# Patient Record
Sex: Male | Born: 1965 | Race: Black or African American | Hispanic: No | Marital: Single | Smoking: Never smoker
Health system: Southern US, Community
[De-identification: ages and names within clinical notes are randomized; demographics above are authoritative.]

## PROBLEM LIST (undated history)

## (undated) DIAGNOSIS — I10 Essential (primary) hypertension: Secondary | ICD-10-CM

## (undated) DIAGNOSIS — R6250 Unspecified lack of expected normal physiological development in childhood: Secondary | ICD-10-CM

## (undated) DIAGNOSIS — C189 Malignant neoplasm of colon, unspecified: Secondary | ICD-10-CM

## (undated) DIAGNOSIS — L309 Dermatitis, unspecified: Secondary | ICD-10-CM

## (undated) HISTORY — DX: Essential (primary) hypertension: I10

## (undated) HISTORY — DX: Dermatitis, unspecified: L30.9

## (undated) HISTORY — DX: Unspecified lack of expected normal physiological development in childhood: R62.50

## (undated) HISTORY — DX: Malignant neoplasm of colon, unspecified: C18.9

## (undated) HISTORY — PX: COLON SURGERY: SHX602

---

## 2017-08-24 ENCOUNTER — Encounter: Payer: Self-pay | Admitting: Family Medicine

## 2017-08-24 ENCOUNTER — Ambulatory Visit (INDEPENDENT_AMBULATORY_CARE_PROVIDER_SITE_OTHER): Payer: Medicare Other | Admitting: Family Medicine

## 2017-08-24 ENCOUNTER — Other Ambulatory Visit: Payer: Self-pay

## 2017-08-24 VITALS — BP 138/80 | HR 87 | Temp 98.4°F | Ht 64.9 in | Wt 150.0 lb

## 2017-08-24 DIAGNOSIS — Z0000 Encounter for general adult medical examination without abnormal findings: Secondary | ICD-10-CM | POA: Insufficient documentation

## 2017-08-24 DIAGNOSIS — Z23 Encounter for immunization: Secondary | ICD-10-CM | POA: Diagnosis not present

## 2017-08-24 DIAGNOSIS — Z114 Encounter for screening for human immunodeficiency virus [HIV]: Secondary | ICD-10-CM

## 2017-08-24 DIAGNOSIS — L209 Atopic dermatitis, unspecified: Secondary | ICD-10-CM | POA: Insufficient documentation

## 2017-08-24 DIAGNOSIS — Z7689 Persons encountering health services in other specified circumstances: Secondary | ICD-10-CM

## 2017-08-24 DIAGNOSIS — R6250 Unspecified lack of expected normal physiological development in childhood: Secondary | ICD-10-CM | POA: Diagnosis not present

## 2017-08-24 NOTE — Progress Notes (Signed)
Subjective:  Bryan Ortiz is a 52 y.o. male who presents to the [facility] today to establish care with new provider.   HPI:  Patient moved back to [place] 2 years ago.  He is here to establish with new PCP.  He is intellectually disabled permanent.  He is taken care of by his brother Raeford Razor who was present at the visit and provides history.  Patient is minimally verbal.  He has seen psychiatry in the past, however he is not connected with 1 in the area.  ROS: As per HPI, otherwise all systems reviewed and are negative  PMH:  The following were reviewed and entered/updated in epic: Past Medical History:  Diagnosis Date  . Developmental delay    On disability   Patient Active Problem List   Diagnosis Date Noted  . Developmental delay 08/24/2017  . Atopic dermatitis 08/24/2017  . Healthcare maintenance 08/24/2017   Past Surgical History:  Procedure Laterality Date  . COLON SURGERY      Family History  Problem Relation Age of Onset  . Stroke Father   . Hypertension Father   . Kidney disease Sister   . Stroke Brother     Medications- reviewed and updated Current Outpatient Medications  Medication Sig Dispense Refill  . triamcinolone cream (KENALOG) 0.1 % Apply 1 application topically 2 (two) times daily.     No current facility-administered medications for this visit.     Allergies-reviewed and updated No Known Allergies  Social History   Socioeconomic History  . Marital status: Single    Spouse name: Not on file  . Number of children: Not on file  . Years of education: Not on file  . Highest education level: Not on file  Occupational History  . Occupation: Disabled  Social Needs  . Financial resource strain: Not hard at all  . Food insecurity:    Worry: Never true    Inability: Never true  . Transportation needs:    Medical: No    Non-medical: No  Tobacco Use  . Smoking status: Never Smoker  . Smokeless tobacco: Never Used  Substance and Sexual  Activity  . Alcohol use: Never    Frequency: Never  . Drug use: Never  . Sexual activity: Never    Birth control/protection: None  Lifestyle  . Physical activity:    Days per week: 0 days    Minutes per session: 0 min  . Stress: Not on file  Relationships  . Social connections:    Talks on phone: Not on file    Gets together: Not on file    Attends religious service: Not on file    Active member of club or organization: Not on file    Attends meetings of clubs or organizations: Not on file    Relationship status: Not on file  Other Topics Concern  . Not on file  Social History Narrative   Lives with Brother, Camille Thau, primary care giver.     Objective:  Physical Exam: BP 138/80   Pulse 87   Temp 98.4 F (36.9 C) (Oral)   Ht 5' 4.9" (1.648 m)   Wt 150 lb (68 kg)   SpO2 97%   BMI 25.04 kg/m   Gen: NAD, resting comfortably CV: RRR with no murmurs appreciated Pulm: NWOB, CTAB with no crackles, wheezes, or rhonchi GI: Normal bowel sounds present. Soft, Nontender, Nondistended. MSK: no edema, cyanosis, or clubbing noted Skin: Scaly dry lesion right foot/ankle juncture on ventral surface  Neuro: grossly normal, moves all extremities Psych: Nonverbal, pleasant affect  No results found for this or any previous visit (from the past 72 hour(s)).   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  Patient is well connected with social services and has good support from his brother,His primary caregiver.  Will refer to psychiatry to establish care in the area.    Atopic dermatitis Well controlled with Kenalog cream.    Healthcare maintenance Will order routine labs to get baseline for patient.    Lab Orders     Basic Metabolic Panel     HIV antibody (with reflex)     CBC with Differential  No orders of the defined types were placed in this encounter.   Marny Lowenstein, MD, MS FAMILY MEDICINE RESIDENT - PGY1 08/24/2017  4:45 PM

## 2017-08-24 NOTE — Assessment & Plan Note (Signed)
Will order routine labs to get baseline for patient.

## 2017-08-24 NOTE — Assessment & Plan Note (Signed)
Severe developmental delay from birth, nonverbal.  Patient is well connected with social services and has good support from his brother,His primary caregiver.  Will refer to psychiatry to establish care in the area.

## 2017-08-24 NOTE — Assessment & Plan Note (Signed)
Well controlled with Kenalog cream.

## 2017-08-24 NOTE — Patient Instructions (Signed)

## 2017-08-25 LAB — BASIC METABOLIC PANEL
BUN / CREAT RATIO: 14 (ref 9–20)
BUN: 14 mg/dL (ref 6–24)
CALCIUM: 10.1 mg/dL (ref 8.7–10.2)
CHLORIDE: 104 mmol/L (ref 96–106)
CO2: 24 mmol/L (ref 20–29)
CREATININE: 1 mg/dL (ref 0.76–1.27)
GFR calc non Af Amer: 88 mL/min/{1.73_m2} (ref >59)
GFR, EST AFRICAN AMERICAN: 102 mL/min/{1.73_m2} (ref >59)
Glucose: 125 mg/dL — ABNORMAL HIGH (ref 65–99)
Potassium: 4.1 mmol/L (ref 3.5–5.2)
Sodium: 140 mmol/L (ref 134–144)

## 2017-08-25 LAB — HIV ANTIBODY (ROUTINE TESTING W REFLEX): HIV Screen 4th Generation wRfx: NONREACTIVE

## 2017-08-25 LAB — CBC WITH DIFFERENTIAL/PLATELET
BASOS ABS: 0 10*3/uL (ref 0.0–0.2)
BASOS: 0 %
EOS (ABSOLUTE): 0.2 10*3/uL (ref 0.0–0.4)
Eos: 2 %
HEMOGLOBIN: 15.8 g/dL (ref 13.0–17.7)
Hematocrit: 48.7 % (ref 37.5–51.0)
IMMATURE GRANULOCYTES: 0 %
Immature Grans (Abs): 0 10*3/uL (ref 0.0–0.1)
Lymphocytes Absolute: 0.7 10*3/uL (ref 0.7–3.1)
Lymphs: 10 %
MCH: 29.8 pg (ref 26.6–33.0)
MCHC: 32.4 g/dL (ref 31.5–35.7)
MCV: 92 fL (ref 79–97)
MONOCYTES: 8 %
Monocytes Absolute: 0.6 10*3/uL (ref 0.1–0.9)
NEUTROS ABS: 5.6 10*3/uL (ref 1.4–7.0)
NEUTROS PCT: 80 %
PLATELETS: 187 10*3/uL (ref 150–450)
RBC: 5.31 x10E6/uL (ref 4.14–5.80)
RDW: 12.4 % (ref 12.3–15.4)
WBC: 7.1 10*3/uL (ref 3.4–10.8)

## 2017-08-27 ENCOUNTER — Encounter: Payer: Self-pay | Admitting: Family Medicine

## 2018-10-23 ENCOUNTER — Ambulatory Visit (INDEPENDENT_AMBULATORY_CARE_PROVIDER_SITE_OTHER): Payer: Medicare Other | Admitting: Family Medicine

## 2018-10-23 ENCOUNTER — Encounter: Payer: Self-pay | Admitting: Family Medicine

## 2018-10-23 ENCOUNTER — Other Ambulatory Visit: Payer: Self-pay

## 2018-10-23 VITALS — BP 124/70 | HR 93 | Ht 65.0 in | Wt 157.0 lb

## 2018-10-23 DIAGNOSIS — Z23 Encounter for immunization: Secondary | ICD-10-CM | POA: Diagnosis not present

## 2018-10-23 DIAGNOSIS — Z0000 Encounter for general adult medical examination without abnormal findings: Secondary | ICD-10-CM

## 2018-10-23 DIAGNOSIS — Z1211 Encounter for screening for malignant neoplasm of colon: Secondary | ICD-10-CM | POA: Diagnosis not present

## 2018-10-23 DIAGNOSIS — E162 Hypoglycemia, unspecified: Secondary | ICD-10-CM | POA: Diagnosis not present

## 2018-10-23 DIAGNOSIS — Z13220 Encounter for screening for lipoid disorders: Secondary | ICD-10-CM

## 2018-10-23 DIAGNOSIS — E663 Overweight: Secondary | ICD-10-CM

## 2018-10-23 DIAGNOSIS — Z9189 Other specified personal risk factors, not elsewhere classified: Secondary | ICD-10-CM | POA: Diagnosis not present

## 2018-10-23 DIAGNOSIS — L209 Atopic dermatitis, unspecified: Secondary | ICD-10-CM

## 2018-10-23 DIAGNOSIS — F79 Unspecified intellectual disabilities: Secondary | ICD-10-CM | POA: Diagnosis not present

## 2018-10-23 DIAGNOSIS — Z131 Encounter for screening for diabetes mellitus: Secondary | ICD-10-CM

## 2018-10-23 LAB — POCT GLYCOSYLATED HEMOGLOBIN (HGB A1C): Hemoglobin A1C: 5.6 % (ref 4.0–5.6)

## 2018-10-23 MED ORDER — CERAVE EX LOTN: 1.0000 "application " | TOPICAL_LOTION | Freq: Two times a day (BID) | CUTANEOUS | 0 refills | Status: DC

## 2018-10-23 NOTE — Progress Notes (Signed)
SUBJECTIVE:  Bryan Ortiz is a 53 y.o. male presenting for his annual checkup.  Patient with intellectual disability and minimally verbal.  Patient with eczema on fingers, backs of knees.  Mostly controlled with triamcinolone.  History provided by patient's primary caregiver, his brother.  Current Outpatient Medications  Medication Sig Dispense Refill  . triamcinolone cream (KENALOG) 0.1 % Apply 1 application topically 2 (two) times daily.    . Emollient (CERAVE) LOTN Apply 1 application topically 2 (two) times daily. 355 mL 0   No current facility-administered medications for this visit.     Personal history: History of colon cancer, colon surgery approximately 8 years ago.  History provided by brother, incomplete story.  Family history: In addition to what was already in the chart, also has family history of diabetes in sister.  Allergies: Patient has no known allergies.   ROS:  Feeling well. No dyspnea or chest pain on exertion. No abdominal pain, change in bowel habits, black or bloody stools. No urinary tract or prostatic symptoms. No neurological complaints.   OBJECTIVE:  The patient appears well, alert, oriented x 3, in no distress.  BP 124/70   Pulse 93   Ht 5\' 5"  (1.651 m)   Wt 157 lb (71.2 kg)   SpO2 98%   BMI 26.13 kg/m  ENT normal.  Neck supple. No adenopathy or thyromegaly. PERLA. Lungs are clear, good air entry, no wheezes, rhonchi or rales. S1 and S2 normal, no murmurs, regular rate and rhythm. Abdomen is soft without tenderness, guarding, mass or organomegaly. Extremities show no edema, normal peripheral pulses. Neurological is normal without focal findings.  Skin scaling skin on ventral aspect of fingers bilaterally consistent with eczema.  ASSESSMENT:  healthy adult male with severe intellectual disability and some baseline eczema  PLAN:  1.  Eczema: Continue triamcinolone cream add emollient 2.  History of colon cancer, of age needs to be having colon  screening, asymptomatic will refer to GI for further evaluation 3.  Health maintenance, lipid panel, hemoglobin A1c, 4.  Intellect disability: Establish with psychiatry, refer to social work for [employer]

## 2018-10-23 NOTE — Patient Instructions (Addendum)
We are referring you to gastroenterology for colonoscopy  We are referring you to psychiatry for intellectual disability  We are collecting routine labs to check for kidney function, cholesterol, diabetes   Preventive Care 59-51 Years Old, Male Preventive care refers to lifestyle choices and visits with your health care provider that can promote health and wellness. This includes:  A yearly physical exam. This is also called an annual well check.  Regular dental and eye exams.  Immunizations.  Screening for certain conditions.  Healthy lifestyle choices, such as eating a healthy diet, getting regular exercise, not using drugs or products that contain nicotine and tobacco, and limiting alcohol use. What can I expect for my preventive care visit? Physical exam Your health care provider will check:  Height and weight. These may be used to calculate body mass index (BMI), which is a measurement that tells if you are at a healthy weight.  Heart rate and blood pressure.  Your skin for abnormal spots. Counseling Your health care provider may ask you questions about:  Alcohol, tobacco, and drug use.  Emotional well-being.  Home and relationship well-being.  Sexual activity.  Eating habits.  Work and work Statistician. What immunizations do I need?  Influenza (flu) vaccine  This is recommended every year. Tetanus, diphtheria, and pertussis (Tdap) vaccine  You may need a Td booster every 10 years. Varicella (chickenpox) vaccine  You may need this vaccine if you have not already been vaccinated. Zoster (shingles) vaccine  You may need this after age 53. Measles, mumps, and rubella (MMR) vaccine  You may need at least one dose of MMR if you were born in 1957 or later. You may also need a second dose. Pneumococcal conjugate (PCV13) vaccine  You may need this if you have certain conditions and were not previously vaccinated. Pneumococcal polysaccharide (PPSV23)  vaccine  You may need one or two doses if you smoke cigarettes or if you have certain conditions. Meningococcal conjugate (MenACWY) vaccine  You may need this if you have certain conditions. Hepatitis A vaccine  You may need this if you have certain conditions or if you travel or work in places where you may be exposed to hepatitis A. Hepatitis B vaccine  You may need this if you have certain conditions or if you travel or work in places where you may be exposed to hepatitis B. Haemophilus influenzae type b (Hib) vaccine  You may need this if you have certain risk factors. Human papillomavirus (HPV) vaccine  If recommended by your health care provider, you may need three doses over 6 months. You may receive vaccines as individual doses or as more than one vaccine together in one shot (combination vaccines). Talk with your health care provider about the risks and benefits of combination vaccines. What tests do I need? Blood tests  Lipid and cholesterol levels. These may be checked every 5 years, or more frequently if you are over 88 years old.  Hepatitis C test.  Hepatitis B test. Screening  Lung cancer screening. You may have this screening every year starting at age 52 if you have a 30-pack-year history of smoking and currently smoke or have quit within the past 15 years.  Prostate cancer screening. Recommendations will vary depending on your family history and other risks.  Colorectal cancer screening. All adults should have this screening starting at age 58 and continuing until age 33. Your health care provider may recommend screening at age 61 if you are at increased risk.  You will have tests every 1-10 years, depending on your results and the type of screening test.  Diabetes screening. This is done by checking your blood sugar (glucose) after you have not eaten for a while (fasting). You may have this done every 1-3 years.  Sexually transmitted disease (STD) testing.  Follow these instructions at home: Eating and drinking  Eat a diet that includes fresh fruits and vegetables, whole grains, lean protein, and low-fat dairy products.  Take vitamin and mineral supplements as recommended by your health care provider.  Do not drink alcohol if your health care provider tells you not to drink.  If you drink alcohol: ? Limit how much you have to 0-2 drinks a day. ? Be aware of how much alcohol is in your drink. In the [place] one drink equals one 12 oz bottle of beer (355 mL), one 5 oz glass of wine (148 mL), or one 1 oz glass of hard liquor (44 mL). Lifestyle  Take daily care of your teeth and gums.  Stay active. Exercise for at least 30 minutes on 5 or more days each week.  Do not use any products that contain nicotine or tobacco, such as cigarettes, e-cigarettes, and chewing tobacco. If you need help quitting, ask your health care provider.  If you are sexually active, practice safe sex. Use a condom or other form of protection to prevent STIs (sexually transmitted infections).  Talk with your health care provider about taking a low-dose aspirin every day starting at age 67. What's next?  Go to your health care provider once a year for a well check visit.  Ask your health care provider how often you should have your eyes and teeth checked.  Stay up to date on all vaccines. This information is not intended to replace advice given to you by your health care provider. Make sure you discuss any questions you have with your health care provider. Document Released: 03/05/2015 Document Revised: 01/31/2018 Document Reviewed: 01/31/2018 Elsevier Patient Education  2020 [employer].

## 2018-10-24 ENCOUNTER — Encounter: Payer: Self-pay | Admitting: Family Medicine

## 2018-10-24 LAB — LIPID PANEL
Chol/HDL Ratio: 4.9 ratio (ref 0.0–5.0)
Cholesterol, Total: 182 mg/dL (ref 100–199)
HDL: 37 mg/dL — ABNORMAL LOW (ref >39)
LDL Chol Calc (NIH): 114 mg/dL — ABNORMAL HIGH (ref 0–99)
Triglycerides: 173 mg/dL — ABNORMAL HIGH (ref 0–149)
VLDL Cholesterol Cal: 31 mg/dL (ref 5–40)

## 2018-10-24 NOTE — Progress Notes (Signed)
The 10-year ASCVD risk score Mikey Bussing DC Brooke Bonito., et al., 2013) is: 5.6%   Values used to calculate the score:     Age: 53 years     Sex: Male     Is Non-Hispanic African American: Yes     Diabetic: No     Tobacco smoker: No     Systolic Blood Pressure: [id number] mmHg     Is BP treated: No     HDL Cholesterol: 37 mg/dL     Total Cholesterol: 182 mg/dL

## 2018-10-31 ENCOUNTER — Telehealth: Payer: Self-pay | Admitting: Licensed Clinical Social Worker

## 2018-10-31 NOTE — Telephone Encounter (Signed)
Care Coordination Phone outreach Note Social Work   10/31/2018 Name: Bryan Ortiz MRN: [id number] DOB: August 22, 1965  Bryan Ortiz is a 53 y.o. year old male who sees Bonnita Hollow, MD for primary care. LCSW was consulted for assistance with community resources for indiviudals with intellectual disability. Called patient;s brother [facility] to assess needs and barriers. Assessment : Patient lives with his brother.  Use to attend day workshops when he lived in [place] 5 years ago but has not participated in any programs since moving to [place].  Patient spends all day watching TV. Recommendation: Patient may benefit from and brother is in agreement to explore community day programs for patient. Brother is not interested in patient participating until after COVID-19.  Interventions: Provided patient's brother information on 3 local training and day programs Immunologist, [employer] and [employer] and support program.     Plan:  1. Patient's brother will call agencies provided to determine which program is best for patient. He will complete paper work and get on wait list if they have one. 2. No further LCSW services needed at this time. Patient's brother will call if he has questions  Dr. Grandville Silos has been informed of this outreach and plan.  Casimer Lanius, LCSW Clinical Social Worker [place] [facility] [place]   [phone] 10:22 AM

## 2018-12-07 ENCOUNTER — Other Ambulatory Visit: Payer: Self-pay | Admitting: Family Medicine

## 2018-12-07 DIAGNOSIS — L209 Atopic dermatitis, unspecified: Secondary | ICD-10-CM

## 2019-01-07 ENCOUNTER — Other Ambulatory Visit: Payer: Self-pay

## 2019-01-07 ENCOUNTER — Ambulatory Visit (INDEPENDENT_AMBULATORY_CARE_PROVIDER_SITE_OTHER): Payer: Medicare Other | Admitting: Family Medicine

## 2019-01-07 VITALS — BP 120/85 | HR 89 | Wt 150.4 lb

## 2019-01-07 DIAGNOSIS — L209 Atopic dermatitis, unspecified: Secondary | ICD-10-CM

## 2019-01-07 MED ORDER — BETAMETHASONE VALERATE 0.1 % EX OINT: 1.0000 "application " | TOPICAL_OINTMENT | Freq: Two times a day (BID) | CUTANEOUS | 0 refills | Status: DC

## 2019-01-07 NOTE — Assessment & Plan Note (Signed)
Severe eczema flare resistant to moderate steroid dose - Stop triamcinolone ointment - Start Betamethasone Valerate ointment BID for two weeks - F/u in two weeks to ensure good response; if not I would consider adding oral medications to control symptoms and referral to derm

## 2019-01-07 NOTE — Patient Instructions (Signed)
It was great to meet you today! Thank you for letting me participate in your care!  Today, we discussed Bryan Ortiz's recent eczema flare up. I have stopped the triamcinalone ointment and want him to use a strong steroid ointment that I have sent to the pharmacy. Please you use it as directed.   Continue using moisturizing lotions and creams 4 times per day to help the skin repair itself.   Please follow up in two weeks to see if he has had any improvement. If he has not gotten any better we will discuss adding further medications versus a referral to dermatology.2  Be well, Bryan Rutherford, DO PGY-3, [facility]

## 2019-01-07 NOTE — Progress Notes (Signed)
     Subjective: Chief Complaint  Patient presents with  . Eczema   HPI: Bryan Ortiz is a 53 y.o. presenting to clinic today to discuss the following:  Acute Eczema Flare Patient is present with his father who comes with PMH of chronic eczema having another significant flare up. It is primarly on his hands and lower legs. It is very itchy but not painful. It has scabbed over from him scratching. No fever, chills, cough, congestion, abdominal pain, nausea, or vomiting. Per dad this is his typical eczema flare up presentation and it has not responded well to the triamcinolone ointment he was prescribed at the last appointment. He is also using Eucerin cream which seems to help some.  ROS noted in HPI.    Social History   Tobacco Use  Smoking Status Never Smoker  Smokeless Tobacco Never Used    Objective: BP 120/85   Pulse 89   Wt 150 lb 6.4 oz (68.2 kg)   SpO2 99%   BMI 25.03 kg/m  Vitals and nursing notes reviewed  Physical Exam  Gen: NAD Skin: severe hypopigmented areas of erythematous, patchy, plaques in various stages of healing and some excoriation   Assessment/Plan:  Atopic dermatitis Severe eczema flare resistant to moderate steroid dose - Stop triamcinolone ointment - Start Betamethasone Valerate ointment BID for two weeks - F/u in two weeks to ensure good response; if not I would consider adding oral medications to control symptoms and referral to derm   PATIENT EDUCATION PROVIDED: See AVS    Diagnosis and plan along with any newly prescribed medication(s) were discussed in detail with this patient today. The patient verbalized understanding and agreed with the plan. Patient advised if symptoms worsen return to clinic or ER.    Meds ordered this encounter  Medications  . betamethasone valerate ointment (VALISONE) 0.1 %    Sig: Apply 1 application topically 2 (two) times daily.    Dispense:  30 g    Refill:  0     Tim Garlan Fillers, DO 01/07/2019, 11:03  AM PGY-3 [place]

## 2019-01-22 ENCOUNTER — Other Ambulatory Visit: Payer: Self-pay

## 2019-01-22 ENCOUNTER — Ambulatory Visit (INDEPENDENT_AMBULATORY_CARE_PROVIDER_SITE_OTHER): Payer: Medicare Other | Admitting: Family Medicine

## 2019-01-22 VITALS — BP 127/80 | HR 108 | Wt 153.2 lb

## 2019-01-22 DIAGNOSIS — L209 Atopic dermatitis, unspecified: Secondary | ICD-10-CM | POA: Diagnosis present

## 2019-01-22 MED ORDER — BETAMETHASONE VALERATE 0.1 % EX OINT: 1.0000 "application " | TOPICAL_OINTMENT | Freq: Two times a day (BID) | CUTANEOUS | 0 refills | Status: DC

## 2019-01-22 NOTE — Assessment & Plan Note (Signed)
Still has severe eczema. Although betamethasone helped it did not seem to provide a lot of relief.  - Referral to dermatology as I think he may benefit from oral agents. - Refilled betamethasone for two more weeks, apply BID every day for 14 days

## 2019-01-22 NOTE — Patient Instructions (Signed)
It was great to see you today! Thank you for letting me participate in your care!  Today, we discussed your eczema and I am sorry it did not improve much with the new ointment. I have given you two more weeks of betamethasone ointment to help control the symptoms. Please continue to keep the area well moisturized.   I am referring you to a Dermatologist to get further help in controlling the symptoms.  Be well, Harolyn Rutherford, DO PGY-3, [facility]

## 2019-01-22 NOTE — Progress Notes (Signed)
     Subjective: Chief Complaint  Patient presents with  . Eczema    follow up     HPI: Bryan Ortiz is a 53 y.o. presenting to clinic today to discuss the following:  Eczema Follow Up Patient presents today with his father for follow up for his eczema flare. He has severe eczema chronically that about several months ago got worse with severe itching and got worse as he scratched it. Patient has a mental handicap so he has difficulty not scratching. I prescribed him betamethasone ointment to help at his last appointment. Per dad, it has helped some but not a whole lot and at times he still gets severe itching.      ROS noted in HPI.    Social History   Tobacco Use  Smoking Status Never Smoker  Smokeless Tobacco Never Used   Objective: BP 127/80   Pulse (!) 108   Wt 153 lb 3.2 oz (69.5 kg)   SpO2 98%   BMI 25.49 kg/m  Vitals and nursing notes reviewed  Physical Exam  Gen: pleasant, NAD Skin: severe, scaly plaques along with hypopigmentation and excoriations along the dorsum of the hands, knees, and front of the ankles and along the back of the ankles.  Assessment/Plan:  Atopic dermatitis Still has severe eczema. Although betamethasone helped it did not seem to provide a lot of relief.  - Referral to dermatology as I think he may benefit from oral agents. - Refilled betamethasone for two more weeks, apply BID every day for 14 days  PATIENT EDUCATION PROVIDED: See AVS    Diagnosis and plan along with any newly prescribed medication(s) were discussed in detail with this patient today. The patient verbalized understanding and agreed with the plan. Patient advised if symptoms worsen return to clinic or ER.    Orders Placed This Encounter  Procedures  . Ambulatory referral to Dermatology    Referral Priority:   Routine    Referral Type:   Consultation    Referral Reason:   Specialty Services Required    Requested Specialty:   Dermatology    Number of Visits  Requested:   1    Meds ordered this encounter  Medications  . betamethasone valerate ointment (VALISONE) 0.1 %    Sig: Apply 1 application topically 2 (two) times daily.    Dispense:  30 g    Refill:  0     Tim Garlan Fillers, DO 01/22/2019, 10:25 AM PGY-3 [place]

## 2019-02-25 ENCOUNTER — Ambulatory Visit (INDEPENDENT_AMBULATORY_CARE_PROVIDER_SITE_OTHER): Payer: Medicare Other | Admitting: Family Medicine

## 2019-02-25 ENCOUNTER — Other Ambulatory Visit: Payer: Self-pay

## 2019-02-25 ENCOUNTER — Encounter: Payer: Self-pay | Admitting: Family Medicine

## 2019-02-25 VITALS — BP 150/76 | HR 86 | Wt 147.0 lb

## 2019-02-25 DIAGNOSIS — L209 Atopic dermatitis, unspecified: Secondary | ICD-10-CM | POA: Diagnosis present

## 2019-02-25 MED ORDER — BETAMETHASONE DIPROPIONATE 0.05 % EX OINT: TOPICAL_OINTMENT | Freq: Two times a day (BID) | CUTANEOUS | 0 refills | Status: DC

## 2019-02-25 NOTE — Progress Notes (Signed)
    Subjective:  Bryan Ortiz is a 54 y.o. male who presents to the [facility] today with a chief complaint of eczema.   History obtained from father.   HPI:  Patient brought in today by father.  Patient with extensive flexural eczema on bilateral hands, posterior knees, anterior ankles.  Recently seen the office on 01/2019.  Was trialed on betamethasone valerate.  Has been applying this twice daily and Eucerin as an emollient.  Has had little improvement.  Denies any pain with hand movement, fevers or chills.  Patient was referred to dermatology however, they have not heard back from the referral.  ROS: Per HPI   Objective:  Physical Exam: BP (!) 150/76   Pulse 86   Wt 147 lb (66.7 kg)   SpO2 97%   BMI 24.46 kg/m   Gen: NAD, resting comfortably Skin: extensive dry, cracked skin on bilateral hands, posterior knees and anterior ankles, no erythema or signs of infection Neuro: grossly normal, moves all extremities Psych: Patient with ID. No agitation. Calm demenior.      No results found for this or any previous visit (from the past 72 hour(s)).   Assessment/Plan:  Atopic dermatitis Minimal improvement on betamethasone valerate.  Will increase steroid potency betamethasone dipropionate ointment.  Also recommend rubbing hands and applying Vaseline at night.  Have given patient the information to his dermatology referral.  Patient follow-up in 1 month if no improvement.   Lab Orders  No laboratory test(s) ordered today    Meds ordered this encounter  Medications  . betamethasone dipropionate (DIPROLENE) 0.05 % ointment    Sig: Apply topically 2 (two) times daily.    Dispense:  30 g    Refill:  0      Marny Lowenstein, MD, MS FAMILY MEDICINE RESIDENT - PGY2 02/25/2019 2:45 PM

## 2019-02-25 NOTE — Assessment & Plan Note (Signed)
Minimal improvement on betamethasone valerate.  Will increase steroid potency betamethasone dipropionate ointment.  Also recommend rubbing hands and applying Vaseline at night.  Have given patient the information to his dermatology referral.  Patient follow-up in 1 month if no improvement.

## 2019-02-25 NOTE — Patient Instructions (Signed)
It was a pleasure to see you today! Thank you for choosing [facility] for your primary care. Bryan Ortiz was seen for exzema.   Please put gloves on at night with vassaline. Please use new ointment. It is stronger than previous.   Please contact dermatology at   [address] [place], [place] [address] [phone]  [name], MD, [place] - PGY3 02/25/2019 2:32 PM

## 2019-03-27 ENCOUNTER — Telehealth: Payer: Self-pay

## 2019-03-27 NOTE — Telephone Encounter (Signed)
Called home phone number, number not in service. Called mobile number, rang with no answer or VM. If pt calls, please ask him the COVID screening questions. Ottis Stain, CMA

## 2019-03-28 ENCOUNTER — Encounter: Payer: Self-pay | Admitting: Family Medicine

## 2019-03-28 ENCOUNTER — Other Ambulatory Visit: Payer: Self-pay

## 2019-03-28 ENCOUNTER — Ambulatory Visit (INDEPENDENT_AMBULATORY_CARE_PROVIDER_SITE_OTHER): Payer: Medicare Other | Admitting: Family Medicine

## 2019-03-28 DIAGNOSIS — L209 Atopic dermatitis, unspecified: Secondary | ICD-10-CM

## 2019-03-28 MED ORDER — BETAMETHASONE DIPROPIONATE 0.05 % EX OINT: TOPICAL_OINTMENT | Freq: Two times a day (BID) | CUTANEOUS | 0 refills | Status: DC

## 2019-03-28 NOTE — Patient Instructions (Signed)
It was a pleasure to see you today! Thank you for choosing [facility] for your primary care. Bryan Ortiz was seen for eczema.  I am glad that it is improved.  You may also use Ace wrap around other parts the body with Vaseline rubs in between.   Please contact dermatology at:  [facility] [address] [place], [place] [address] [phone]   [name], MD, MS FAMILY MEDICINE RESIDENT - PGY3 03/28/2019 9:36 AM

## 2019-03-28 NOTE — Assessment & Plan Note (Signed)
Improved eczema on hand.  Recommend continued current therapy.  May also wrap ankles with Vaseline to improve care.  Recommend follow-up with dermatology.  Referral has already been placed.  We have provided information and encouraged to follow-up.

## 2019-03-28 NOTE — Progress Notes (Signed)
   CHIEF COMPLAINT / HPI:  Patient with history of intellectual disability. History obtained from Caregiver.   Patient follow-up for atopic dermatitis.  Patient with extensive disease involving flexural surfaces including his hand.  Previously seen beginning of January.  Patient was trialed on betamethasone dipropionate 0.5% for his hand, continued use of betamethasone valerate 0.1% for other flexural surfaces.  Patient also had his hand with Vaseline and gloves at night. Reports significant improvement in use of his hand.  Not much change in the remainder of the eczema with the remainder of his other patches of eczema including anterior ankles posterior knees.   Has not been able to follow-up with dermatology.  PERTINENT  PMH / PSH: As above ID  OBJECTIVE: BP (!) 150/60   Pulse 86   Wt 148 lb 4 oz (67.2 kg)   SpO2 98%   BMI 24.67 kg/m   GEN: NAD, seated comfortably CV: RRR with no murmurs appreciated Pulm: NWOB, CTAB with no crackles, wheezes, or rhonchi GI: Normal bowel sounds present. Soft, Nontender, Nondistended. MSK: no edema, cyanosis, or clubbing noted Skin: warm, dry Neuro: grossly normal, moves all extremities Psych: Normal affect and thought content   ASSESSMENT / PLAN:  Atopic dermatitis Improved eczema on hand.  Recommend continued current therapy.  May also wrap ankles with Vaseline to improve care.  Recommend follow-up with dermatology.  Referral has already been placed.  We have provided information and encouraged to follow-up.      Bonnita Hollow, MD [place]

## 2019-07-30 ENCOUNTER — Other Ambulatory Visit: Payer: Self-pay | Admitting: Family Medicine
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
## 2019-11-04 ENCOUNTER — Other Ambulatory Visit: Payer: Self-pay

## 2019-11-04 ENCOUNTER — Encounter: Payer: Self-pay | Admitting: Family Medicine

## 2019-11-04 ENCOUNTER — Ambulatory Visit (INDEPENDENT_AMBULATORY_CARE_PROVIDER_SITE_OTHER): Payer: Medicare Other | Admitting: Family Medicine

## 2019-11-04 VITALS — BP 134/74 | HR 80 | Ht 65.0 in | Wt 148.0 lb

## 2019-11-04 DIAGNOSIS — Z85038 Personal history of other malignant neoplasm of large intestine: Secondary | ICD-10-CM | POA: Insufficient documentation

## 2019-11-04 DIAGNOSIS — E785 Hyperlipidemia, unspecified: Secondary | ICD-10-CM | POA: Diagnosis not present

## 2019-11-04 DIAGNOSIS — Z13220 Encounter for screening for lipoid disorders: Secondary | ICD-10-CM

## 2019-11-04 DIAGNOSIS — Z1211 Encounter for screening for malignant neoplasm of colon: Secondary | ICD-10-CM

## 2019-11-04 DIAGNOSIS — Z131 Encounter for screening for diabetes mellitus: Secondary | ICD-10-CM

## 2019-11-04 DIAGNOSIS — E1169 Type 2 diabetes mellitus with other specified complication: Secondary | ICD-10-CM | POA: Diagnosis not present

## 2019-11-04 DIAGNOSIS — Z1159 Encounter for screening for other viral diseases: Secondary | ICD-10-CM

## 2019-11-04 DIAGNOSIS — Z0000 Encounter for general adult medical examination without abnormal findings: Secondary | ICD-10-CM

## 2019-11-04 DIAGNOSIS — C189 Malignant neoplasm of colon, unspecified: Secondary | ICD-10-CM | POA: Insufficient documentation

## 2019-11-04 DIAGNOSIS — R6250 Unspecified lack of expected normal physiological development in childhood: Secondary | ICD-10-CM | POA: Diagnosis not present

## 2019-11-04 DIAGNOSIS — Z23 Encounter for immunization: Secondary | ICD-10-CM | POA: Diagnosis not present

## 2019-11-04 MED ORDER — BETAMETHASONE VALERATE 0.1 % EX OINT: TOPICAL_OINTMENT | CUTANEOUS | 1 refills | Status: DC

## 2019-11-04 NOTE — Patient Instructions (Addendum)
It was great to see you!  Our plans for today:  - I have ordered a referral for colonoscopy. They should call you in the next 2 weeks to schedules. - We are checking some labs today and I will let you know when they result. - I have placed a referral for psychiatry to get established in [place]. -I have sent in a refill of your eczema cream.  I recommend only using this as needed as this is a pretty strong steroid and can cause skin color changes and other issues if used long-term.  As you are only using this on occasion when he has a flare I think this is reasonable to refill at this time.  We are checking some labs today, I will call you if they are abnormal will send you a MyChart message or a letter if they are normal.  If you do not hear about your labs in the next 2 weeks please let us know.  Take care and seek immediate care sooner if you develop any concerns.   Dr. [facility]   Psychiatry Resource List (Adults and Children) Most of these providers will take Medicaid. please consult your insurance for a complete and updated list of available providers. When calling to make an appointment have your insurance information available to confirm you are covered.   [facility], [place]:   [facility]: [address]     [phone]   Linna Hoff: [place]. [place],        [phone] [place]: [place],    [place] [facility]: [facility] [place],                   [place] [facility]: [facility]         [facility]  (Psychiatry only; Adults /children 12 and over, will take Medicaid)  [place], [place], [place] [address]       [phone]   [facility] (Psychiatry & counseling ;  adults & children ; will take Medicaid [address]  [address]  [place] [address]   Go on-line to complete referral ( [email or web address] [phone]    (Spanish therapist)  [facility]  Psychiatry & counseling; Adults and children;  Call Registration prior to scheduling an appointment [phone] [address]    [place], [place] [address]    [phone]  [facility] (Psychiatry & counseling; adults & children; Medicare no Medicaid)  [place], [place]  [address]      [phone]    [facility] (up to age 96)  Psychiatry & counseling ,will take Medicaid, must do counseling to receive psychiatry services  [address] [place] [address]        ([place] (Psychiatry & counseling; adults & children; will take Medicaid) Will need a referral from provider [address],  [place], [place]  [phone]   [facility] --- Walk-In Mon-Friday 8am-3pm ( will take Medicaid, Psychiatry, Adults & children,  [address] [place], [place]   [phone]   [facility]--, Walk-in M-F 8am-12pm and 1pm -3pm   (Counseling, Psychiatry, will take Medicaid,  adults & children)  [address] [place], [place]  [phone]
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
## 2019-11-04 NOTE — Assessment & Plan Note (Signed)
Assessment: 54 year old male with a history of developmental delay, mostly nonverbal.  Patient's brother is his caretaker and states that he previously followed with psychiatry prior to moving to [place].  He would like to get established with psychiatry here.  Denies other concerns at this time including behavioral concerns. Plan: -Provided referral for psychiatry to help patient get established in this area -Discussed with patient to feel free to make a follow-up appointment with [place] should we be able to assist in any other ways.

## 2019-11-04 NOTE — Assessment & Plan Note (Signed)
Plan: -We will check cholesterol and hep C -Flu shot and Covid vaccine provided

## 2019-11-05 LAB — HEPATITIS C ANTIBODY: Hep C Virus Ab: <0.1 s/co ratio (ref 0.0–0.9)

## 2019-11-05 LAB — CHOLESTEROL, TOTAL: Cholesterol, Total: 193 mg/dL (ref 100–199)

## 2019-11-25 ENCOUNTER — Other Ambulatory Visit: Payer: Self-pay

## 2019-11-25 ENCOUNTER — Ambulatory Visit (INDEPENDENT_AMBULATORY_CARE_PROVIDER_SITE_OTHER): Payer: Medicare Other

## 2019-11-25 DIAGNOSIS — Z23 Encounter for immunization: Secondary | ICD-10-CM | POA: Diagnosis present

## 2019-11-25 NOTE — Progress Notes (Signed)
   Covid-19 Vaccination Clinic  Name:  Adelfo Diebel    MRN: [id number] DOB: 12-31-1965  11/25/2019  Mr. Baade was observed post Covid-19 immunization for 15 minutes without incident. He was provided with Vaccine Information Sheet and instruction to access the V-Safe system.   Mr. Radde was instructed to call 911 with any severe reactions post vaccine: Marland Kitchen Difficulty breathing  . Swelling of face and throat  . A fast heartbeat  . A bad rash all over body  . Dizziness and weakness   #2 Covid Vaccine administered RD without complication.

## 2020-04-07 ENCOUNTER — Other Ambulatory Visit: Payer: Self-pay | Admitting: Family Medicine

## 2020-07-02 ENCOUNTER — Other Ambulatory Visit: Payer: Self-pay | Admitting: Family Medicine
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
## 2020-11-16 ENCOUNTER — Encounter: Payer: Self-pay | Admitting: Family Medicine

## 2020-11-16 ENCOUNTER — Ambulatory Visit (INDEPENDENT_AMBULATORY_CARE_PROVIDER_SITE_OTHER): Payer: Medicare Other | Admitting: Family Medicine

## 2020-11-16 ENCOUNTER — Other Ambulatory Visit: Payer: Self-pay

## 2020-11-16 VITALS — BP 150/70 | HR 96 | Wt 154.4 lb

## 2020-11-16 DIAGNOSIS — C189 Malignant neoplasm of colon, unspecified: Secondary | ICD-10-CM | POA: Diagnosis not present

## 2020-11-16 DIAGNOSIS — Z13220 Encounter for screening for lipoid disorders: Secondary | ICD-10-CM | POA: Diagnosis not present

## 2020-11-16 DIAGNOSIS — E785 Hyperlipidemia, unspecified: Secondary | ICD-10-CM | POA: Diagnosis not present

## 2020-11-16 DIAGNOSIS — R739 Hyperglycemia, unspecified: Secondary | ICD-10-CM

## 2020-11-16 DIAGNOSIS — I10 Essential (primary) hypertension: Secondary | ICD-10-CM | POA: Insufficient documentation

## 2020-11-16 DIAGNOSIS — Z23 Encounter for immunization: Secondary | ICD-10-CM | POA: Diagnosis not present

## 2020-11-16 LAB — POCT GLYCOSYLATED HEMOGLOBIN (HGB A1C): Hemoglobin A1C: 5.5 % (ref 4.0–5.6)

## 2020-11-16 MED ORDER — AMLODIPINE BESYLATE 5 MG PO TABS: 5.0000 mg | ORAL_TABLET | Freq: Every day | ORAL | 3 refills | Status: DC

## 2020-11-16 NOTE — Assessment & Plan Note (Signed)
Blood pressure elevated at 150/70.  They have been checking blood pressures at home in the 140s over 70s to 150s over 70s.  We will initiate amlodipine 5 mg daily.  We will follow-up in 1 to 2 weeks for blood pressure check.  The plan to continue to check his pressures for 2-3 times per week until the follow-up and write these numbers down to bring to follow-up.

## 2020-11-16 NOTE — Addendum Note (Signed)
Addended by: Lurline Del on: 11/16/2020 03:40 PM   Modules accepted: Orders

## 2020-11-16 NOTE — Patient Instructions (Signed)
I have placed a referral for the gastroenterologist because of your history of colon cancer.  This should reach out to you in the next 1 week to set up an appointment for down the road.  If they do not please let me know.  We are going to check for your cholesterol levels today.  We checked your A1c today which is a screen of your blood sugars over the past 3 months and it was within normal limits  For your blood pressure it was a bit elevated today we will start a medication called amlodipine.  I have sent this to your pharmacy.  I will see back in 1 to 2 weeks.  At that appointment we will do a blood pressure check.  In the meantime I have low for you to check your blood pressures 2-3 times per week and write these numbers down to bring to the next appointment.

## 2020-11-17 LAB — LIPID PANEL
Chol/HDL Ratio: 4.7 ratio (ref 0.0–5.0)
Cholesterol, Total: 177 mg/dL (ref 100–199)
HDL: 38 mg/dL — ABNORMAL LOW (ref >39)
LDL Chol Calc (NIH): 111 mg/dL — ABNORMAL HIGH (ref 0–99)
Triglycerides: 155 mg/dL — ABNORMAL HIGH (ref 0–149)
VLDL Cholesterol Cal: 28 mg/dL (ref 5–40)

## 2020-11-23 ENCOUNTER — Other Ambulatory Visit: Payer: Self-pay | Admitting: Family Medicine

## 2020-11-25 ENCOUNTER — Emergency Department (HOSPITAL_COMMUNITY): Payer: Medicare Other

## 2020-11-25 ENCOUNTER — Emergency Department (HOSPITAL_COMMUNITY)
Admission: EM | Admit: 2020-11-25 | Discharge: 2020-11-25 | Disposition: A | Discharge status: Home / Self Care | Payer: Medicare Other | Attending: Emergency Medicine | Admitting: Emergency Medicine

## 2020-11-25 ENCOUNTER — Encounter (HOSPITAL_COMMUNITY): Payer: Self-pay | Admitting: *Deleted

## 2020-11-25 DIAGNOSIS — Z85038 Personal history of other malignant neoplasm of large intestine: Secondary | ICD-10-CM | POA: Insufficient documentation

## 2020-11-25 DIAGNOSIS — I10 Essential (primary) hypertension: Secondary | ICD-10-CM | POA: Insufficient documentation

## 2020-11-25 DIAGNOSIS — M25551 Pain in right hip: Secondary | ICD-10-CM | POA: Diagnosis present

## 2020-11-25 DIAGNOSIS — Z79899 Other long term (current) drug therapy: Secondary | ICD-10-CM | POA: Insufficient documentation

## 2020-11-25 MED ORDER — HYDROCODONE-ACETAMINOPHEN 5-325 MG PO TABS
1.0000 | ORAL_TABLET | Freq: Once | ORAL | Status: AC
  Administered 2020-11-25: 1 via ORAL
  Filled 2020-11-25: qty 1

## 2020-11-25 MED ORDER — HYDROCODONE-ACETAMINOPHEN 5-325 MG PO TABS: 1.0000 | ORAL_TABLET | ORAL | 0 refills | Status: DC | PRN

## 2020-11-25 NOTE — Discharge Instructions (Addendum)
X-ray and CT scan are reassuring today.  Take Norco as needed as prescribed for pain. Recheck with your primary care provider.  Return to the emergency room for severe concerning symptoms. Norco can cause constipation.  Managed with Colace and MiraLAX as needed.

## 2020-11-25 NOTE — ED Triage Notes (Signed)
Per EMS, pt from home complains of right thigh pain since 630AM. Reports this pain for the past 2 days, pain goes away throughout the day. No deformity or contusion, pain is only present with palpation. A&O at baseline, hx of cognitive impairment. Brother is here.   BP 192/90 HR 110 RR 16 O2 96%

## 2020-11-25 NOTE — ED Provider Notes (Signed)
[facility] DEPT Provider Note   CSN: [id number] Arrival date & time: 11/25/20  4431     History Chief Complaint  Patient presents with   Leg Pain    Bryan Ortiz is a 55 y.o. male.  55 year old male with history of intellectual disability, brought in by EMS with brother at bedside with complaint of right hip pain.  Patient's mother states that he indicated anterior proximal right hip pain yesterday however this was brief in the morning and resolved.  Patient woke up again this morning with pain in his right hip, now indicates lateral hip, reports significant pain if trying to stand, unable to bear weight.  Patient is normally ambulatory without assistance.  No history of falls.  No other complaints or concerns.      Past Medical History:  Diagnosis Date   Colon cancer ([place])    Developmental delay    On disability    Patient Active Problem List   Diagnosis Date Noted   Hypertension 11/16/2020   Colon cancer ([place])    Developmental delay 08/24/2017   Atopic dermatitis 08/24/2017   Healthcare maintenance 08/24/2017    Past Surgical History:  Procedure Laterality Date   COLON SURGERY         Family History  Problem Relation Age of Onset   Stroke Father    Hypertension Father    Kidney disease Sister    Diabetes Sister    Stroke Brother     Social History   Tobacco Use   Smoking status: Never   Smokeless tobacco: Never  Substance Use Topics   Alcohol use: Never   Drug use: Never    Home Medications Prior to Admission medications   Medication Sig Start Date End Date Taking? Authorizing Provider  HYDROcodone-acetaminophen (NORCO/VICODIN) 5-325 MG tablet Take 1 tablet by mouth every 4 (four) hours as needed. 11/25/20  Yes Tacy Learn, PA-C  amLODipine (NORVASC) 5 MG tablet Take 1 tablet (5 mg total) by mouth at bedtime. 11/16/20   Lurline Del, DO  betamethasone dipropionate (DIPROLENE) 0.05 % ointment Apply topically 2  (two) times daily. 03/28/19   Bonnita Hollow, MD  betamethasone valerate ointment (VALISONE) 0.1 % APPLY TO AFFECTED AREA TWICE A DAY 07/05/20   Lurline Del, DO  [facility] DAILY MOISTURIZING 1.3 % LOTN APPLY TO AFFECTED AREA TWICE A DAY 12/09/18   Bonnita Hollow, MD    Allergies    Patient has no known allergies.  Review of Systems   Review of Systems  Unable to perform ROS: Other (Level 5 caveat applies for intellectual disability.)   Physical Exam Updated Vital Signs BP (!) 160/81   Pulse (!) 111   Temp 99.7 F (37.6 C) (Oral)   Resp 16   SpO2 98%   Physical Exam Vitals and nursing note reviewed.  Constitutional:      Appearance: Normal appearance.  HENT:     Head: Normocephalic and atraumatic.  Cardiovascular:     Pulses: Normal pulses.  Pulmonary:     Effort: Pulmonary effort is normal.  Abdominal:     Palpations: Abdomen is soft.     Tenderness: There is no abdominal tenderness.  Musculoskeletal:        General: No swelling, tenderness or deformity.     Cervical back: Neck supple.     Right knee: Normal.     Left knee: Normal.     Comments: No pain with palpation of right hip or pelvis.  Able  to range hip without difficulty.  On attempt to stand, is unable to bear weight on the right leg and indicates pain in the right hip. No pain with palpation or range of motion of right knee or ankle.  Skin:    General: Skin is warm and dry.     Findings: No bruising, erythema or rash.  Neurological:     Mental Status: He is alert. Mental status is at baseline.  Psychiatric:        Behavior: Behavior normal.    ED Results / Procedures / Treatments   Labs (all labs ordered are listed, but only abnormal results are displayed) Labs Reviewed - No data to display  EKG None  Radiology CT PELVIS WO CONTRAST  Result Date: 11/25/2020 CLINICAL DATA:  Right hip pain, will not bear weight EXAM: CT PELVIS WITHOUT CONTRAST TECHNIQUE: Multidetector CT imaging of the pelvis was  performed following the standard protocol without intravenous contrast. COMPARISON:  No prior CT, correlation is made with same day hip radiographs FINDINGS: Urinary Tract:  No abnormality visualized. Bowel:  Unremarkable visualized pelvic bowel loops. Vascular/Lymphatic: No pathologically enlarged lymph nodes. No significant vascular abnormality seen. Reproductive:  The prostate is present. Other: No free fluid or free air in the pelvis. Scattered phleboliths. Musculoskeletal: No acute osseous abnormality. Negative for fracture. No suspicious lesions. IMPRESSION: No acute process in the pelvis. No evidence of fracture or other osseous abnormality. Electronically Signed   By: Merilyn Baba M.D.   On: 11/25/2020 12:40   DG Hip Unilat With Pelvis 2-3 Views Right  Result Date: 11/25/2020 CLINICAL DATA:  55 year old male with history of right-sided hip pain after a fall. EXAM: DG HIP (WITH OR WITHOUT PELVIS) 2-3V RIGHT COMPARISON:  No priors. FINDINGS: There is no evidence of hip fracture or dislocation. Mild joint space narrowing, subchondral sclerosis, subchondral cyst formation and osteophyte formation in the hip joints bilaterally, compatible with osteoarthritis. IMPRESSION: 1. No acute radiographic abnormality of the bony pelvis or right hip. 2. 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 Electronically Signed   By: Vinnie Langton M.D.   On: 11/25/2020 09:13    Procedures Procedures   Medications Ordered in ED Medications  HYDROcodone-acetaminophen (NORCO/VICODIN) 5-325 MG per tablet 1 tablet (1 tablet Oral Given 11/25/20 1007)    ED Course  I have reviewed the triage vital signs and the nursing notes.  Pertinent labs & imaging results that were available during my care of the patient were reviewed by me and considered in my medical decision making (see chart for details).  Clinical Course as of 11/25/20 1246  Thu Nov 25, 9861  4061 55 year old male with complaint of right hip pain as  above.  Found to have pain with active range of motion, no pain with passive range of motion.  X-ray of the right hip was unremarkable.  Patient is unable to bear weight secondary to pain.  He was given Norco and sent for CT scan.  CT returns and is unremarkable, no acute injury or other explanation for patient's pain.  Patient was rechecked after the Norco, pain has improved, he has much better active range of motion of his hip.  Plan is to discharge with prescription for Norco with PCP follow-up. [LM]    Clinical Course User Index [LM] Roque Lias   MDM Rules/Calculators/A&P  Final Clinical Impression(s) / ED Diagnoses Final diagnoses:  Right hip pain    Rx / DC Orders ED Discharge Orders          Ordered    HYDROcodone-acetaminophen (NORCO/VICODIN) 5-325 MG tablet  Every 4 hours PRN        11/25/20 1245             Tacy Learn, PA-C 11/25/20 1247    Hayden Rasmussen, MD 11/25/20 1843

## 2020-11-25 NOTE — ED Provider Notes (Signed)
Emergency Medicine Provider Triage Evaluation Note  Bryan Ortiz , a 55 y.o. male  was evaluated in triage.  Pt complains of right thigh pain, brought in by EMS from home. Onset 6:30am, usually goes away during the day.  Mild cognitive impairment, brother is POA, followed EMS to the ER. BP and HR elevated for EMS. Denies falls.   Review of Systems  Positive: Right thigh pain Negative: swelling  Physical Exam  There were no vitals taken for this visit. Gen:   Awake, no distress   Resp:  Normal effort  MSK:   Moves LLE, upper extremities, won't move right hip independently due to pain, no pain with my ROM  Other:  DP pulses intact, sensation intact, abdomen soft and non tender  Medical Decision Making  Medically screening exam initiated at 8:18 AM.  Appropriate orders placed.  Bryan Ortiz was informed that the remainder of the evaluation will be completed by another provider, this initial triage assessment does not replace that evaluation, and the importance of remaining in the ED until their evaluation is complete.     Tacy Learn, PA-C 11/25/20 5916    Hayden Rasmussen, MD 11/25/20 [phone]

## 2020-12-10 ENCOUNTER — Other Ambulatory Visit: Payer: Self-pay | Admitting: Family Medicine

## 2020-12-13 ENCOUNTER — Other Ambulatory Visit: Payer: Self-pay

## 2020-12-13 ENCOUNTER — Ambulatory Visit (INDEPENDENT_AMBULATORY_CARE_PROVIDER_SITE_OTHER): Payer: Medicare Other

## 2020-12-13 ENCOUNTER — Encounter: Payer: Self-pay | Admitting: Student

## 2020-12-13 ENCOUNTER — Ambulatory Visit (INDEPENDENT_AMBULATORY_CARE_PROVIDER_SITE_OTHER): Payer: Medicare Other | Admitting: Student

## 2020-12-13 VITALS — BP 124/68 | HR 104 | Ht 65.0 in | Wt 154.0 lb

## 2020-12-13 DIAGNOSIS — L209 Atopic dermatitis, unspecified: Secondary | ICD-10-CM | POA: Diagnosis not present

## 2020-12-13 DIAGNOSIS — I10 Essential (primary) hypertension: Secondary | ICD-10-CM | POA: Diagnosis not present

## 2020-12-13 DIAGNOSIS — Z23 Encounter for immunization: Secondary | ICD-10-CM | POA: Diagnosis present

## 2020-12-13 MED ORDER — BETAMETHASONE VALERATE 0.1 % EX OINT: TOPICAL_OINTMENT | CUTANEOUS | 1 refills | Status: DC

## 2020-12-13 NOTE — Assessment & Plan Note (Signed)
Well controlled. Refilled betamethason valerate 0.1%. Advised using only as needed for when eczematous patches occur and not daily. -monitor progression

## 2020-12-13 NOTE — Progress Notes (Signed)
    SUBJECTIVE:   CHIEF COMPLAINT / HPI: Blood pressure recheck  Hypertension Amlodipine 5 mg started at last visit. Home BPs ranged 421-031Y systolic/ 81V-88Q diastolic. Denies any lightheadedness, fainting, SOB, headaches or vision changes. Accompanied by brother who helps administering medications and checking Bps.  Atopic Dermatitis Has eczematous lesions on extensor surfaces. Has been applying valisone 0.1% as needed on affected areas and has been well controlled.  PERTINENT  PMH / PSH: developmental delay  OBJECTIVE:   BP 124/68   Pulse (!) 104   Ht 5\' 5"  (1.651 m)   Wt 154 lb (69.9 kg)   SpO2 98%   BMI 25.63 kg/m   General: Well appearing, NAD, responsive to some questions and brother helps answer Head: Normocephalic atraumatic CV: Regular rate and rhythm no murmurs rubs or gallops Respiratory: Clear to ausculation bilaterally, no wheezes rales or crackles Extremities: Moves upper and lower extremities freely, no edema in LE Neuro: No focal deficits Skin: Some eczematous patches visualized on elbows bilaterally  ASSESSMENT/PLAN:   Atopic dermatitis Well controlled. Refilled betamethason valerate 0.1%. Advised using only as needed for when eczematous patches occur and not daily. -monitor progression  Hypertension Initial blood pressure in office was 150/70 and repeat was 124/68 at end of visit. Home Bps 120s-140s/60-80s -Continue amlodipine 5 mg daily -advised taking blood pressures at home -F/u 3 months for BP check   Gerrit Heck, MD [place]

## 2020-12-13 NOTE — Assessment & Plan Note (Signed)
Initial blood pressure in office was 150/70 and repeat was 124/68 at end of visit. Home Bps 120s-140s/60-80s -Continue amlodipine 5 mg daily -advised taking blood pressures at home -F/u 3 months for BP check

## 2020-12-13 NOTE — Patient Instructions (Addendum)
It was great to see you! Thank you for allowing me to participate in your care!   I recommend that you always bring your medications to each appointment as this makes it easy to ensure we are on the correct medications and helps [place] not miss when refills are needed.  Our plans for today:  - We will keep the amlodipine of 5 mg daily. It is preferred to take it at bedtime. Please let us know if you get lightheaded at all.  - We will follow up in another 3 month to see if there is differences in your blood pressure -Continue to exercise and decrease salt intake as much as possible -You are getting your covid booster today -I refilled your cream, please only use this as needed not daily, let us know if improving  Take care and seek immediate care sooner if you develop any concerns. Please remember to show up 15 minutes before your scheduled appointment time!  Gerrit Heck, MD [place]

## 2021-03-24 ENCOUNTER — Encounter: Payer: Self-pay | Admitting: Family Medicine

## 2021-04-05 ENCOUNTER — Encounter: Payer: Self-pay | Admitting: Internal Medicine

## 2021-04-13 ENCOUNTER — Telehealth: Payer: Self-pay | Admitting: *Deleted
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
## 2021-04-13 NOTE — Telephone Encounter (Signed)
Called pt's brothers mobile and home number to attempt to make an OV- both rang with no answer and no machine to leave a message

## 2021-04-14 NOTE — Telephone Encounter (Signed)
Dr's recommendations given to pt's brother-made OV with Dr.Dorsey for 3/29, Colon and PV cancelled. Pt's brother aware.

## 2021-05-17 ENCOUNTER — Encounter: Payer: Medicare Other | Admitting: Internal Medicine

## 2021-05-18 ENCOUNTER — Encounter: Payer: Self-pay | Admitting: Internal Medicine

## 2021-05-18 ENCOUNTER — Ambulatory Visit (INDEPENDENT_AMBULATORY_CARE_PROVIDER_SITE_OTHER): Payer: Medicare Other | Admitting: Internal Medicine

## 2021-05-18 VITALS — BP 140/64 | HR 115 | Ht 65.0 in | Wt 156.6 lb

## 2021-05-18 DIAGNOSIS — Z1211 Encounter for screening for malignant neoplasm of colon: Secondary | ICD-10-CM

## 2021-05-18 DIAGNOSIS — Z85038 Personal history of other malignant neoplasm of large intestine: Secondary | ICD-10-CM

## 2021-05-18 MED ORDER — NA SULFATE-K SULFATE-MG SULF 17.5-3.13-1.6 GM/177ML PO SOLN: 1.0000 | Freq: Once | ORAL | 0 refills | Status: AC

## 2021-05-18 NOTE — Progress Notes (Addendum)
? ?Chief Complaint: Colon cancer screening ? ?HPI : 56 year old male with history of colon cancer s/p surgery presents with colon cancer screening ? ?Patient presents to discuss colon cancer screening.  Patient is partially nonverbal due to developmental delay.  He has lived with his brother for the last 6 years.  Prior to that, he lived with his mother in [place], [place].  Per the patient's brother he has been previously diagnosed with colon cancer.  This colon cancer diagnosis may have occurred around 10 years ago and he received a colon surgery at that time.  The patient's brother is not entirely clear medical history because his mother would have kept track of those records.  His surgery was reportedly done at [facility].  Previous PCP was Dr. Loletta Specter in [place].  He has not had a colonoscopy after his surgery. Denies fam hx of GI issues.  Patient eats well at home.  Denies any blood in stools, weight loss, changes in bowel habits.  Denies nausea, vomiting, dysphagia.  He denies blood thinners. ? ? ?Past Medical History:  ?Diagnosis Date  ? Colon cancer ([place])   ? Developmental delay   ? On disability  ? ? ? ?Past Surgical History:  ?Procedure Laterality Date  ? COLON SURGERY    ? ?Family History  ?Problem Relation Age of Onset  ? Stroke Father   ? Hypertension Father   ? Kidney disease Sister   ? Diabetes Sister   ? Stroke Brother   ? ?Social History  ? ?Tobacco Use  ? Smoking status: Never  ? Smokeless tobacco: Never  ?Substance Use Topics  ? Alcohol use: Never  ? Drug use: Never  ? ?Current Outpatient Medications  ?Medication Sig Dispense Refill  ? amLODipine (NORVASC) 5 MG tablet Take 1 tablet (5 mg total) by mouth at bedtime. 90 tablet 3  ? betamethasone valerate ointment (VALISONE) 0.1 % APPLY TO AFFECTED AREA TWICE A DAY 30 g 1  ? ?No current facility-administered medications for this visit.  ? ?No Known Allergies ? ? ?Review of Systems: ?All systems reviewed and negative except where noted in HPI.   ? ?Physical Exam: ?BP 140/64   Pulse (!) 115   Ht '5\' 5"'$  (1.651 m)   Wt 156 lb 9.6 oz (71 kg)   BMI 26.06 kg/m?  ?Constitutional: Pleasant,well-developed, male in no acute distress. ?HEENT: Normocephalic and atraumatic. Conjunctivae are normal. No scleral icterus. ?Cardiovascular: Normal rate, regular rhythm.  ?Pulmonary/chest: Effort normal and breath sounds normal. No wheezing, rales or rhonchi. ?Abdominal: Soft, nondistended, nontender. Bowel sounds active throughout. There are no masses palpable. No hepatomegaly. ?Extremities: No edema ?Neurological: Alert and oriented to person place and time. ?Skin: Skin is warm and dry. No rashes noted. ?Psychiatric: Normal mood and affect. Behavior is normal. ? ?Labs 08/2017: CBC nml. BMP unremarkable. ? ?CT pelvis w/o contrast 11/25/20: ?IMPRESSION: ?No acute process in the pelvis. No evidence of fracture or other ?osseous abnormality. ? ?ASSESSMENT AND PLAN: ?Colon cancer screening ?History of colon cancer ?Patient presents to discuss colon cancer screening.  I went over risks and benefits of colonoscopy with the patient and his brother, and they wish to proceed with the colonoscopy procedure.  We will attempt to get records of his prior colon surgery to better determine how to proceed with future follow-up. ?- Obtain records of his prior surgery done in [place], [place] ?- Colonoscopy [facility] ? ?[name], MD ? ?ADDENDUM: Received notes from [facility]. He underwent sigmoid resection  with anastomosis on 11/26/09. He had undergone a colonoscopy with Dr. Darcel Bayley and was found to have a lesion in the mid sigmoid colon that was tattooed. Pathology from the segmental resection did not show any cancer. There was no overt histopathologic abnormality seen on the resected section of the colon. ?

## 2021-05-18 NOTE — Patient Instructions (Signed)
You have been scheduled for a colonoscopy. Please follow written instructions given to you at your visit today.  ?Please pick up your prep supplies at the pharmacy within the next 1-3 days. ?If you use inhalers (even only as needed), please bring them with you on the day of your procedure. ? ?If you are age 54 or older, your body mass index should be between 23-30. Your Body mass index is 26.06 kg/m?Marland Kitchen If this is out of the aforementioned range listed, please consider follow up with your Primary Care Provider. ? ?If you are age 86 or younger, your body mass index should be between 19-25. Your Body mass index is 26.06 kg/m?Marland Kitchen If this is out of the aformentioned range listed, please consider follow up with your Primary Care Provider.  ? ?________________________________________________________ ? ?The [facility] providers would like to encourage you to use [facility] to communicate with providers for non-urgent requests or questions.  Due to long hold times on the telephone, sending your provider a message by [facility] may be a faster and more efficient way to get a response.  Please allow 48 business hours for a response.  Please remember that this is for non-urgent requests.  ?_______________________________________________________ ? ?

## 2021-06-14 ENCOUNTER — Encounter: Payer: Self-pay | Admitting: Internal Medicine

## 2021-06-14 ENCOUNTER — Ambulatory Visit (AMBULATORY_SURGERY_CENTER): Payer: Medicare Other | Admitting: Internal Medicine

## 2021-06-14 VITALS — BP 127/73 | HR 78 | Temp 98.0°F | Resp 12 | Ht 65.0 in | Wt 156.0 lb

## 2021-06-14 DIAGNOSIS — Z1211 Encounter for screening for malignant neoplasm of colon: Secondary | ICD-10-CM | POA: Diagnosis not present

## 2021-06-14 DIAGNOSIS — D124 Benign neoplasm of descending colon: Secondary | ICD-10-CM | POA: Diagnosis not present

## 2021-06-14 DIAGNOSIS — D123 Benign neoplasm of transverse colon: Secondary | ICD-10-CM | POA: Diagnosis not present

## 2021-06-14 DIAGNOSIS — Z85038 Personal history of other malignant neoplasm of large intestine: Secondary | ICD-10-CM

## 2021-06-14 MED ORDER — SODIUM CHLORIDE 0.9 % IV SOLN: 500.0000 mL | Freq: Once | INTRAVENOUS | Status: DC

## 2021-06-14 NOTE — Progress Notes (Signed)
Called to room to assist during endoscopic procedure.  Patient ID and intended procedure confirmed with present staff. Received instructions for my participation in the procedure from the performing physician.  

## 2021-06-14 NOTE — Progress Notes (Signed)
Pt's states no medical or surgical changes since previsit or office visit. 

## 2021-06-14 NOTE — Patient Instructions (Signed)
Please read handouts provided. Continue present medications. Await pathology results.   YOU HAD AN ENDOSCOPIC PROCEDURE TODAY AT THE [facility]:   Refer to the procedure report that was given to you for any specific questions about what was found during the examination.  If the procedure report does not answer your questions, please call your gastroenterologist to clarify.  If you requested that your care partner not be given the details of your procedure findings, then the procedure report has been included in a sealed envelope for you to review at your convenience later.  YOU SHOULD EXPECT: Some feelings of bloating in the abdomen. Passage of more gas than usual.  Walking can help get rid of the air that was put into your GI tract during the procedure and reduce the bloating. If you had a lower endoscopy (such as a colonoscopy or flexible sigmoidoscopy) you may notice spotting of blood in your stool or on the toilet paper. If you underwent a bowel prep for your procedure, you may not have a normal bowel movement for a few days.  Please Note:  You might notice some irritation and congestion in your nose or some drainage.  This is from the oxygen used during your procedure.  There is no need for concern and it should clear up in a day or so.  SYMPTOMS TO REPORT IMMEDIATELY:  Following lower endoscopy (colonoscopy or flexible sigmoidoscopy):  Excessive amounts of blood in the stool  Significant tenderness or worsening of abdominal pains  Swelling of the abdomen that is new, acute  Fever of 100F or higher   For urgent or emergent issues, a gastroenterologist can be reached at any hour by calling [phone]. Do not use MyChart messaging for urgent concerns.    DIET:  We do recommend a small meal at first, but then you may proceed to your regular diet.  Drink plenty of fluids but you should avoid alcoholic beverages for 24 hours.  ACTIVITY:  You should plan to take it easy  for the rest of today and you should NOT DRIVE or use heavy machinery until tomorrow (because of the sedation medicines used during the test).    FOLLOW UP: Our staff will call the number listed on your records 48-72 hours following your procedure to check on you and address any questions or concerns that you may have regarding the information given to you following your procedure. If we do not reach you, we will leave a message.  We will attempt to reach you two times.  During this call, we will ask if you have developed any symptoms of COVID 19. If you develop any symptoms (ie: fever, flu-like symptoms, shortness of breath, cough etc.) before then, please call [phone].  If you test positive for Covid 19 in the 2 weeks post procedure, please call and report this information to us.    If any biopsies were taken you will be contacted by phone or by letter within the next 1-3 weeks.  Please call us at [phone] if you have not heard about the biopsies in 3 weeks.    SIGNATURES/CONFIDENTIALITY: You and/or your care partner have signed paperwork which will be entered into your electronic medical record.  These signatures attest to the fact that that the information above on your After Visit Summary has been reviewed and is understood.  Full responsibility of the confidentiality of this discharge information lies with you and/or your care-partner.  

## 2021-06-14 NOTE — Op Note (Signed)
[place] ?Patient Name: Bryan Ortiz ?Procedure Date: 06/14/2021 10:17 AM ?MRN: [id number] ?Endoscopist: Bryan Ortiz "Bryan Ortiz ,  ?Age: 56 ?Referring MD:  ?Date of Birth: [birth year] ?Gender: Male ?Account #: [id number] ?Procedure:                Colonoscopy ?Indications:              Screening for colorectal malignant neoplasm ?Medicines:                Monitored Anesthesia Care ?Procedure:                Pre-Anesthesia Assessment: ?                          - Prior to the procedure, a History and Physical  ?                          was performed, and patient medications and  ?                          allergies were reviewed. The patient's tolerance of  ?                          previous anesthesia was also reviewed. The risks  ?                          and benefits of the procedure and the sedation  ?                          options and risks were discussed with the patient.  ?                          All questions were answered, and informed consent  ?                          was obtained. Prior Anticoagulants: The patient has  ?                          taken no previous anticoagulant or antiplatelet  ?                          agents. ASA Grade Assessment: II - A patient with  ?                          mild systemic disease. After reviewing the risks  ?                          and benefits, the patient was deemed in  ?                          satisfactory condition to undergo the procedure. ?                          After obtaining informed consent, the colonoscope  ?  was passed under direct vision. Throughout the  ?                          procedure, the patient's blood pressure, pulse, and  ?                          oxygen saturations were monitored continuously. The  ?                          CF [id number] #[id number] was introduced through the anus  ?                          and advanced to the the terminal ileum. The  ?                          colonoscopy was  performed without difficulty. The  ?                          patient tolerated the procedure well. The quality  ?                          of the bowel preparation was good. The terminal  ?                          ileum, ileocecal valve, appendiceal orifice, and  ?                          rectum were photographed. ?Scope In: 10:22:09 AM ?Scope Out: 10:37:22 AM ?Scope Withdrawal Time: 0 hours 13 minutes 19 seconds  ?Total Procedure Duration: 0 hours 15 minutes 13 seconds  ?Findings:                 The terminal ileum appeared normal. ?                          Three sessile polyps were found in the sigmoid  ?                          colon and transverse colon. The polyps were 2 to 10  ?                          mm in size. These polyps were removed with a cold  ?                          snare. Resection and retrieval were complete. ?                          There was evidence of a prior end-to-end  ?                          colo-colonic anastomosis in the sigmoid colon. This  ?                          was patent and was characterized  by healthy  ?                          appearing mucosa. The anastomosis was traversed. ?                          Non-bleeding internal hemorrhoids were found during  ?                          retroflexion. ?Complications:            No immediate complications. ?Estimated Blood Loss:     Estimated blood loss was minimal. ?Impression:               - The examined portion of the ileum was normal. ?                          - Three 2 to 10 mm polyps in the sigmoid colon and  ?                          in the transverse colon, removed with a cold snare.  ?                          Resected and retrieved. ?                          - Patent end-to-end colo-colonic anastomosis,  ?                          characterized by healthy appearing mucosa. ?                          - Non-bleeding internal hemorrhoids. ?Recommendation:           - Discharge patient to home (with escort). ?                           - Await pathology results. ?                          - The findings and recommendations were discussed  ?                          with the patient. ?Georgian Co,  ?06/14/2021 10:43:18 AM ?

## 2021-06-14 NOTE — Progress Notes (Signed)
? ?  GASTROENTEROLOGY PROCEDURE H&P NOTE  ? ?Primary Care Physician: ?Lurline Del, DO ? ? ? ?Reason for Procedure:   Colon cancer screening, s/p sigmoid colon resection ? ?Plan:    Colonoscopy ? ?Patient is appropriate for endoscopic procedure(s) in the ambulatory ([place]) setting. ? ?The nature of the procedure, as well as the risks, benefits, and alternatives were carefully and thoroughly reviewed with the patient. Ample time for discussion and questions allowed. The patient understood, was satisfied, and agreed to proceed.  ? ? ? ?HPI: ?Bryan Ortiz is a 56 y.o. male who presents for colonoscopy for evaluation of colon cancer screening.  Patient was most recently seen in the Gastroenterology Clinic on 05/18/21.  No interval change in medical history since that appointment. Please refer to that note for full details regarding GI history and clinical presentation.  ? ?Past Medical History:  ?Diagnosis Date  ? Colon cancer ([place])   ? Developmental delay   ? On disability  ? ? ?Past Surgical History:  ?Procedure Laterality Date  ? COLON SURGERY    ? ? ?Prior to Admission medications   ?Medication Sig Start Date End Date Taking? Authorizing Provider  ?amLODipine (NORVASC) 5 MG tablet Take 1 tablet (5 mg total) by mouth at bedtime. 11/16/20   Lurline Del, DO  ?betamethasone valerate ointment (VALISONE) 0.1 % APPLY TO AFFECTED AREA TWICE A DAY 12/13/20   Gerrit Heck, MD  ? ? ?Current Outpatient Medications  ?Medication Sig Dispense Refill  ? amLODipine (NORVASC) 5 MG tablet Take 1 tablet (5 mg total) by mouth at bedtime. 90 tablet 3  ? betamethasone valerate ointment (VALISONE) 0.1 % APPLY TO AFFECTED AREA TWICE A DAY 30 g 1  ? ?No current facility-administered medications for this visit.  ? ? ?Allergies as of 06/14/2021  ? (No Known Allergies)  ? ? ?Family History  ?Problem Relation Age of Onset  ? Stroke Father   ? Hypertension Father   ? Kidney disease Sister   ? Diabetes Sister   ? Stroke Brother   ? ? ?Social  History  ? ?Socioeconomic History  ? Marital status: Single  ?  Spouse name: Not on file  ? Number of children: Not on file  ? Years of education: Not on file  ? Highest education level: Not on file  ?Occupational History  ? Occupation: Disabled  ?Tobacco Use  ? Smoking status: Never  ? Smokeless tobacco: Never  ?Substance and Sexual Activity  ? Alcohol use: Never  ? Drug use: Never  ? Sexual activity: Never  ?  Birth control/protection: None  ?Other Topics Concern  ? Not on file  ?Social History Narrative  ? Lives with Brother, Lamond Glantz, primary care giver.   ? ?Social Determinants of Health  ? ?Financial Resource Strain: Not on file  ?Food Insecurity: Not on file  ?Transportation Needs: Not on file  ?Physical Activity: Not on file  ?Stress: Not on file  ?Social Connections: Not on file  ?Intimate Partner Violence: Not on file  ? ? ?Physical Exam: ?Vital signs in last 24 hours: ?There were no vitals taken for this visit. ?GEN: NAD ?EYE: Sclerae anicteric ?ENT: MMM ?CV: Non-tachycardic ?Pulm: No increased WOB ?GI: Soft ?NEURO:  Alert & Oriented ? ? ?[name], MD ?[facility] ? ? ?06/14/2021 9:38 AM ? ?

## 2021-06-14 NOTE — Progress Notes (Signed)
A and O x3. Report to RN. Tolerated MAC anesthesia well. 

## 2021-06-16 ENCOUNTER — Encounter: Payer: Self-pay | Admitting: Internal Medicine

## 2021-06-16 ENCOUNTER — Telehealth: Payer: Self-pay

## 2021-06-16 NOTE — Telephone Encounter (Signed)
?  Follow up Call- ? ? ?  06/14/2021  ?  9:52 AM  ?Call back number  ?Post procedure Call Back phone  # [phone] brother-Mahlon  ?Permission to leave phone message Yes  ?  ? ?Patient questions: ? ?Do you have a fever, pain , or abdominal swelling? No. ?Pain Score  0 * ? ?Have you tolerated food without any problems? Yes.   ? ?Have you been able to return to your normal activities? Yes.   ? ?Do you have any questions about your discharge instructions: ?Diet   No. ?Medications  No. ?Follow up visit  No. ? ?Do you have questions or concerns about your Care? No. ? ?Actions: ?* If pain score is 4 or above: ?No action needed, pain <4. ? ? ?

## 2021-08-25 ENCOUNTER — Other Ambulatory Visit: Payer: Self-pay | Admitting: Student

## 2021-08-25 DIAGNOSIS — L209 Atopic dermatitis, unspecified: Secondary | ICD-10-CM

## 2021-10-17 ENCOUNTER — Ambulatory Visit (INDEPENDENT_AMBULATORY_CARE_PROVIDER_SITE_OTHER): Payer: Medicare Other | Admitting: Family Medicine

## 2021-10-17 ENCOUNTER — Encounter: Payer: Self-pay | Admitting: Family Medicine

## 2021-10-17 DIAGNOSIS — I10 Essential (primary) hypertension: Secondary | ICD-10-CM | POA: Diagnosis not present

## 2021-10-17 DIAGNOSIS — L209 Atopic dermatitis, unspecified: Secondary | ICD-10-CM

## 2021-10-17 MED ORDER — BETAMETHASONE VALERATE 0.1 % EX OINT: TOPICAL_OINTMENT | CUTANEOUS | 1 refills | Status: DC

## 2021-10-17 NOTE — Assessment & Plan Note (Signed)
Well controlled. Patient's brother takes blood pressure at home as well  - Continue current regimen. 5 mg amlodipine daily

## 2021-10-17 NOTE — Progress Notes (Signed)
    SUBJECTIVE:   CHIEF COMPLAINT / HPI:   Hypertension  Has been taking his pill daily. Has been checking his blood pressure at home. His brotehr helps him. Says that it is good.    Eczema   Patient is unable to say how long he has been having dry skin. He says it does not itch or hurt. He would like the cream he was prescribed previously.   PERTINENT  PMH / PSH: HTN, Eczema  OBJECTIVE:   BP 138/70   Pulse (!) 115   Ht '5\' 5"'$  (1.651 m)   Wt 158 lb (71.7 kg)   SpO2 98%   BMI 26.29 kg/m   General: Pleasant intellectually delayed man  CV: well perfused Resp: Normal work of breathing on room air  Derm: hands with violaceous hyperpigmented dry thickened skin, R upper thigh with less thickened hyperpigmented area, dry skin throughout, some hypopigmented patches near these thickened areas.   ASSESSMENT/PLAN:   Hypertension Well controlled. Patient's brother takes blood pressure at home as well  - Continue current regimen. 5 mg amlodipine daily   Atopic dermatitis Current flare on dorsal surfaces of hands bilaterally and on R upper thigh.  - Topical betamethasone valerate 0.1%     Lowry Ram, MD [place]

## 2021-10-17 NOTE — Patient Instructions (Signed)
It was great to see you today! Thank you for choosing [facility] for your primary care. Bryan Ortiz was seen for atopic dermatitis/eczema.  Today we addressed: Eczema - I have prescribed your ointment. Please apply a cream like aquafor first and then apply the ointment when you are having an eczema flare. Do not apply if not necessary.  Hypertension - Continue to check your blood pressure at home. It is otherwise well controlled. Continue to take your medication.   If you haven't already, sign up for My Chart to have easy access to your labs results, and communication with your primary care physician.  We are checking some labs today. If they are abnormal, I will call you. If they are normal, I will send you a MyChart message (if it is active) or a letter in the mail. If you do not hear about your labs in the next 2 weeks, please call the office.   You should return to our clinic Return in about 1 year (around 10/18/2022).  I recommend that you always bring your medications to each appointment as this makes it easy to ensure you are on the correct medications and helps [place] not miss refills when you need them.  Please arrive 15 minutes before your appointment to ensure smooth check in process.  We appreciate your efforts in making this happen.  Please call the clinic at [phone] if your symptoms worsen or you have any concerns.  Thank you for allowing me to participate in your care, Lowry Ram, MD 10/17/2021, 2:59 PM PGY-1, [place]

## 2021-10-17 NOTE — Assessment & Plan Note (Signed)
Current flare on dorsal surfaces of hands bilaterally and on R upper thigh.  - Topical betamethasone valerate 0.1%

## 2022-04-25 ENCOUNTER — Telehealth: Payer: Self-pay | Admitting: Family Medicine

## 2022-04-25 NOTE — Telephone Encounter (Signed)
Contacted Cristopher Bjornson to schedule their annual wellness visit. Appointment made for 05/02/2022.  Thank you,  [place] Direct dial  [phone]

## 2022-05-02 ENCOUNTER — Ambulatory Visit (INDEPENDENT_AMBULATORY_CARE_PROVIDER_SITE_OTHER): Payer: Medicare Other

## 2022-05-02 VITALS — Ht 65.0 in | Wt 158.0 lb

## 2022-05-02 DIAGNOSIS — Z0000 Encounter for general adult medical examination without abnormal findings: Secondary | ICD-10-CM | POA: Diagnosis not present

## 2022-05-02 NOTE — Progress Notes (Addendum)
Subjective:   Bryan Ortiz is a 57 y.o. male who presents for Medicare Annual/Subsequent preventive examination.  Review of Systems    Virtual Visit via Telephone Note  I connected with  Bryan Ortiz on 05/02/22 at  1:30 PM EDT by telephone and verified that I am speaking with the correct person using two identifiers.  Location: Patient: Home Provider: Office Persons participating in the virtual visit: patient/Nurse Health Advisor   I discussed the limitations, risks, security and privacy concerns of performing an evaluation and management service by telephone and the availability of in person appointments. The patient expressed understanding and agreed to proceed.  Interactive audio and video telecommunications were attempted between this nurse and patient, however failed, due to patient having technical difficulties OR patient did not have access to video capability.  We continued and completed visit with audio only.  Some vital signs may be absent or patient reported.   Bryan Peaches, LPN  Cardiac Risk Factors include: advanced age (>25mn, >>13women);male gender;sedentary lifestyle;hypertension     Objective:    Today's Vitals   05/02/22 1334  Weight: 158 lb (71.7 kg)  Height: '5\' 5"'$  (1.651 m)   Body mass index is 26.29 kg/m.     05/02/2022    1:46 PM 10/17/2021    2:11 PM 11/16/2020    2:49 PM 11/04/2019    9:00 AM 02/25/2019    2:07 PM 10/23/2018    1:47 PM 08/24/2017    1:38 PM  Advanced Directives  Does Patient Have a Medical Advance Directive? No No No No No No No  Would patient like information on creating a medical advance directive? No - Patient declined No - Patient declined No - Patient declined No - Guardian declined No - Patient declined No - Patient declined No - Patient declined    Current Medications (verified) Outpatient Encounter Medications as of 05/02/2022  Medication Sig   amLODipine (NORVASC) 5 MG tablet Take 1 tablet (5 mg total) by mouth at  bedtime.   betamethasone valerate ointment (VALISONE) 0.1 % APPLY TO AFFECTED AREA TWICE A DAY DURING A FLARE, APPLY CREAM LIKE AQUAFOR FIRST AND THEN APPLY OINTMENT   No facility-administered encounter medications on file as of 05/02/2022.    Allergies (verified) Patient has no known allergies.   History: Past Medical History:  Diagnosis Date   Colon cancer ([facility]    Developmental delay    On disability   Eczema    Hypertension    Past Surgical History:  Procedure Laterality Date   COLON SURGERY     Family History  Problem Relation Age of Onset   Stroke Father    Hypertension Father    Kidney disease Sister    Diabetes Sister    Stroke Brother    Colon cancer Neg Hx    Esophageal cancer Neg Hx    Rectal cancer Neg Hx    Stomach cancer Neg Hx    Social History   Socioeconomic History   Marital status: Single    Spouse name: Not on file   Number of children: Not on file   Years of education: Not on file   Highest education level: Not on file  Occupational History   Occupation: Disabled  Tobacco Use   Smoking status: Never   Smokeless tobacco: Never  Vaping Use   Vaping Use: Never used  Substance and Sexual Activity   Alcohol use: Never   Drug use: Never   Sexual activity: Never  Birth control/protection: None  Other Topics Concern   Not on file  Social History Narrative   Lives with Brother, Bryan Ortiz, primary care giver.    Social Determinants of Health   Financial Resource Strain: Low Risk  (05/02/2022)   Overall Financial Resource Strain (CARDIA)    Difficulty of Paying Living Expenses: Not hard at all  Food Insecurity: No Food Insecurity (05/02/2022)   Hunger Vital Sign    Worried About Running Out of Food in the Last Year: Never true    Ran Out of Food in the Last Year: Never true  Transportation Needs: No Transportation Needs (05/02/2022)   PRAPARE - Hydrologist (Medical): No    Lack of Transportation  (Non-Medical): No  Physical Activity: Inactive (05/02/2022)   Exercise Vital Sign    Days of Exercise per Week: 0 days    Minutes of Exercise per Session: 0 min  Stress: No Stress Concern Present (05/02/2022)   [place]    Feeling of Stress : Not at all  Social Connections: Socially Isolated (05/02/2022)   Social Connection and Isolation Panel [NHANES]    Frequency of Communication with Friends and Family: More than three times a week    Frequency of Social Gatherings with Friends and Family: More than three times a week    Attends Religious Services: Never    Marine scientist or Organizations: No    Attends Music therapist: Never    Marital Status: Never married    Tobacco Counseling Counseling given: Not Answered   Clinical Intake:  Pre-visit preparation completed: No  Pain : No/denies pain     BMI - recorded: 26.29 Nutritional Status: BMI 25 -29 Overweight Nutritional Risks: None Diabetes: No  How often do you need to have someone help you when you read instructions, pamphlets, or other written materials from your doctor or pharmacy?: 5 - Always  Diabetic?  No  Interpreter Needed?: No  Information entered by :: Bryan Arbour LPN   Activities of Daily Living    05/02/2022    1:42 PM  In your present state of health, do you have any difficulty performing the following activities:  Hearing? 0  Vision? 0  Difficulty concentrating or making decisions? 0  Walking or climbing stairs? 0  Dressing or bathing? 1  Comment Brother assist  Doing errands, shopping? 1  Comment Brother [place] and eating ? Y  Comment Brother assist  Using the Toilet? N  In the past six months, have you accidently leaked urine? N  Do you have problems with loss of bowel control? N  Managing your Medications? Y  Comment Brother assist  Managing your Finances? N  Comment Brother TEFL teacher or managing your Housekeeping? Y  Comment Brother assist    Patient Care Team: Bryan Ram, MD as PCP - General (Family Medicine)  Indicate any recent Medical Services you may have received from other than [facility] providers in the past year (date may be approximate).     Assessment:   This is a routine wellness examination for [name].  Hearing/Vision screen Hearing Screening - Comments:: Denies hearing difficulties   Vision Screening - Comments:: No vision difficulty  Dietary issues and exercise activities discussed: Current Exercise Habits: The patient does not participate in regular exercise at present, Exercise limited by: neurologic condition(s)   Goals Addressed  This Visit's Progress     No current Goals (pt-stated)         Depression Screen    05/02/2022    1:41 PM 10/17/2021    2:10 PM 12/13/2020   10:25 AM 11/16/2020    2:49 PM 11/04/2019    9:00 AM 02/25/2019    2:06 PM 10/23/2018    1:47 PM  PHQ 2/9 Scores  PHQ - 2 Score 0 0 0 0  0   PHQ- 9 Score  0 0 0     Exception Documentation     Other- indicate reason in comment box  Other- indicate reason in comment box  Not completed     pt nonverbal, no visible signs of distress      Fall Risk    05/02/2022    1:46 PM 11/04/2019    9:00 AM  [place] in the past year? 0 0  Number falls in past yr: 0   Injury with Fall? 0   Risk for fall due to : No Fall Risks   Follow up Falls prevention discussed     [place]:  Any stairs in or around the home? Yes  If so, are there any without handrails? No  Home free of loose throw rugs in walkways, pet beds, electrical cords, etc? Yes  Adequate lighting in your home to reduce risk of falls? Yes   ASSISTIVE DEVICES UTILIZED TO PREVENT FALLS:  Life alert? No  Use of a cane, walker or w/c? No  Grab bars in the bathroom? No  Shower chair or bench in shower? No  Elevated toilet seat or a  handicapped toilet? Yes   TIMED UP AND GO:  Was the test performed? No . Audio Visit   Cognitive Function:        05/02/2022    1:47 PM  6CIT Screen  What Year? 4 points  What month? 3 points  What time? 0 points  Count back from 20 4 points  Months in reverse 4 points  Repeat phrase 10 points  Total Score 25 points    Immunizations Immunization History  Administered Date(s) Administered   Fluad Quad(high Dose 65+) 11/16/2020   Influenza,inj,Quad PF,6+ Mos 10/23/2018, 11/04/2019   PFIZER(Purple Top)SARS-COV-2 Vaccination 11/04/2019, 11/25/2019   Pfizer Covid-19 Vaccine Bivalent Booster 4yr & up 12/13/2020   Tdap 08/24/2017    TDAP status: Up to date  Flu Vaccine status: Up to date    Covid-19 vaccine status: Completed vaccines  Qualifies for Shingles Vaccine? Yes   Zostavax completed No   Shingrix Completed?: No.    Education has been provided regarding the importance of this vaccine. Patient has been advised to call insurance company to determine out of pocket expense if they have not yet received this vaccine. Advised may also receive vaccine at local pharmacy or Health Dept. Verbalized acceptance and understanding.  Screening Tests Health Maintenance  Topic Date Due   COVID-19 Vaccine (4 - 2023-24 season) 05/18/2022 (Originally 10/21/2021)   INFLUENZA VACCINE  05/21/2022 (Originally 09/20/2021)   Zoster Vaccines- Shingrix (1 of 2) 08/02/2022 (Originally [birth year]01/1986)   Medicare Annual Wellness (AWV)  05/02/2023   COLONOSCOPY (Pts 45-456yrInsurance coverage will need to be confirmed)  06/14/2024   DTaP/Tdap/Td (2 - Td or Tdap) 08/25/2027   Hepatitis C Screening  Completed   HIV Screening  Completed   HPV VACCINES  Aged Out    Health Maintenance  There are no  preventive care reminders to display for this patient.   Colorectal cancer screening: Type of screening: Colonoscopy. Completed 06/14/21. Repeat every 3 years  Lung Cancer Screening: (Low Dose CT  Chest recommended if Age 25-80 years, 30 pack-year currently smoking OR have quit w/in 15years.) does not qualify.     Additional Screening:  Hepatitis C Screening: does qualify; Completed 11/04/19  Vision Screening: Recommended annual ophthalmology exams for early detection of glaucoma and other disorders of the eye. Is the patient up to date with their annual eye exam?  No  Who is the provider or what is the name of the office in which the patient attends annual eye exams? Patient deferred If pt is not established with a provider, would they like to be referred to a provider to establish care? No .   Dental Screening: Recommended annual dental exams for proper oral hygiene  Community Resource Referral / Chronic Care Management:  CRR required this visit?  No   CCM required this visit?  No      Plan:     I have personally reviewed and noted the following in the patient's chart:   Medical and social history Use of alcohol, tobacco or illicit drugs  Current medications and supplements including opioid prescriptions. Patient is not currently taking opioid prescriptions. Functional ability and status Nutritional status Physical activity Advanced directives List of other physicians Hospitalizations, surgeries, and ER visits in previous 12 months Vitals Screenings to include cognitive, depression, and falls Referrals and appointments  In addition, I have reviewed and discussed with patient certain preventive protocols, quality metrics, and best practice recommendations. A written personalized care plan for preventive services as well as general preventive health recommendations were provided to patient.     Bryan Peaches, LPN   [id number]   Nurse Notes: None

## 2022-05-02 NOTE — Patient Instructions (Addendum)
Mr. Bryan Ortiz , Thank you for taking time to come for your Medicare Wellness Visit. I appreciate your ongoing commitment to your health goals. Please review the following plan we discussed and let me know if I can assist you in the future.   These are the goals we discussed:  Goals       No current Goals (pt-stated)        This is a list of the screening recommended for you and due dates:  Health Maintenance  Topic Date Due   COVID-19 Vaccine (4 - 2023-24 season) 05/18/2022*   Flu Shot  05/21/2022*   Zoster (Shingles) Vaccine (1 of 2) 08/02/2022*   Medicare Annual Wellness Visit  05/02/2023   Colon Cancer Screening  06/14/2024   DTaP/Tdap/Td vaccine (2 - Td or Tdap) 08/25/2027   Hepatitis C Screening: USPSTF Recommendation to screen - Ages 32-79 yo.  Completed   HIV Screening  Completed   HPV Vaccine  Aged Out  *Topic was postponed. The date shown is not the original due date.    Advanced directives: Advance directive discussed with you today. Even though you declined this today, please call our office should you change your mind, and we can give you the proper paperwork for you to fill out.   Conditions/risks identified: None  Next appointment: Follow up in one year for your annual wellness visit    Preventive Care 40-64 Years, Male Preventive care refers to lifestyle choices and visits with your health care provider that can promote health and wellness. What does preventive care include? A yearly physical exam. This is also called an annual well check. Dental exams once or twice a year. Routine eye exams. Ask your health care provider how often you should have your eyes checked. Personal lifestyle choices, including: Daily care of your teeth and gums. Regular physical activity. Eating a healthy diet. Avoiding tobacco and drug use. Limiting alcohol use. Practicing safe sex. Taking low-dose aspirin every day starting at age 3. What happens during an annual well check? The  services and screenings done by your health care provider during your annual well check will depend on your age, overall health, lifestyle risk factors, and family history of disease. Counseling  Your health care provider may ask you questions about your: Alcohol use. Tobacco use. Drug use. Emotional well-being. Home and relationship well-being. Sexual activity. Eating habits. Work and work Statistician. Screening  You may have the following tests or measurements: Height, weight, and BMI. Blood pressure. Lipid and cholesterol levels. These may be checked every 5 years, or more frequently if you are over 39 years old. Skin check. Lung cancer screening. You may have this screening every year starting at age 88 if you have a 30-pack-year history of smoking and currently smoke or have quit within the past 15 years. Fecal occult blood test (FOBT) of the stool. You may have this test every year starting at age 28. Flexible sigmoidoscopy or colonoscopy. You may have a sigmoidoscopy every 5 years or a colonoscopy every 10 years starting at age 61. Prostate cancer screening. Recommendations will vary depending on your family history and other risks. Hepatitis C blood test. Hepatitis B blood test. Sexually transmitted disease (STD) testing. Diabetes screening. This is done by checking your blood sugar (glucose) after you have not eaten for a while (fasting). You may have this done every 1-3 years. Discuss your test results, treatment options, and if necessary, the need for more tests with your health care provider. Vaccines  Your health care provider may recommend certain vaccines, such as: Influenza vaccine. This is recommended every year. Tetanus, diphtheria, and acellular pertussis (Tdap, Td) vaccine. You may need a Td booster every 10 years. Zoster vaccine. You may need this after age 68. Pneumococcal 13-valent conjugate (PCV13) vaccine. You may need this if you have certain conditions and  have not been vaccinated. Pneumococcal polysaccharide (PPSV23) vaccine. You may need one or two doses if you smoke cigarettes or if you have certain conditions. Talk to your health care provider about which screenings and vaccines you need and how often you need them. This information is not intended to replace advice given to you by your health care provider. Make sure you discuss any questions you have with your health care provider. Document Released: 03/05/2015 Document Revised: 10/27/2015 Document Reviewed: 12/08/2014 Elsevier Interactive Patient Education  2017 [place] Prevention in the Home Falls can cause injuries. They can happen to people of all ages. There are many things you can do to make your home safe and to help prevent falls. What can I do on the outside of my home? Regularly fix the edges of walkways and driveways and fix any cracks. Remove anything that might make you trip as you walk through a door, such as a raised step or threshold. Trim any bushes or trees on the path to your home. Use bright outdoor lighting. Clear any walking paths of anything that might make someone trip, such as rocks or tools. Regularly check to see if handrails are loose or broken. Make sure that both sides of any steps have handrails. Any raised decks and porches should have guardrails on the edges. Have any leaves, snow, or ice cleared regularly. Use sand or salt on walking paths during winter. Clean up any spills in your garage right away. This includes oil or grease spills. What can I do in the bathroom? Use night lights. Install grab bars by the toilet and in the tub and shower. Do not use towel bars as grab bars. Use non-skid mats or decals in the tub or shower. If you need to sit down in the shower, use a plastic, non-slip stool. Keep the floor dry. Clean up any water that spills on the floor as soon as it happens. Remove soap buildup in the tub or shower regularly. Attach  bath mats securely with double-sided non-slip rug tape. Do not have throw rugs and other things on the floor that can make you trip. What can I do in the bedroom? Use night lights. Make sure that you have a light by your bed that is easy to reach. Do not use any sheets or blankets that are too big for your bed. They should not hang down onto the floor. Have a firm chair that has side arms. You can use this for support while you get dressed. Do not have throw rugs and other things on the floor that can make you trip. What can I do in the kitchen? Clean up any spills right away. Avoid walking on wet floors. Keep items that you use a lot in easy-to-reach places. If you need to reach something above you, use a strong step stool that has a grab bar. Keep electrical cords out of the way. Do not use floor polish or wax that makes floors slippery. If you must use wax, use non-skid floor wax. Do not have throw rugs and other things on the floor that can make you trip. What can I  do with my stairs? Do not leave any items on the stairs. Make sure that there are handrails on both sides of the stairs and use them. Fix handrails that are broken or loose. Make sure that handrails are as long as the stairways. Check any carpeting to make sure that it is firmly attached to the stairs. Fix any carpet that is loose or worn. Avoid having throw rugs at the top or bottom of the stairs. If you do have throw rugs, attach them to the floor with carpet tape. Make sure that you have a light switch at the top of the stairs and the bottom of the stairs. If you do not have them, ask someone to add them for you. What else can I do to help prevent falls? Wear shoes that: Do not have high heels. Have rubber bottoms. Are comfortable and fit you well. Are closed at the toe. Do not wear sandals. If you use a stepladder: Make sure that it is fully opened. Do not climb a closed stepladder. Make sure that both sides of the  stepladder are locked into place. Ask someone to hold it for you, if possible. Clearly mark and make sure that you can see: Any grab bars or handrails. First and last steps. Where the edge of each step is. Use tools that help you move around (mobility aids) if they are needed. These include: Canes. Walkers. Scooters. Crutches. Turn on the lights when you go into a dark area. Replace any light bulbs as soon as they burn out. Set up your furniture so you have a clear path. Avoid moving your furniture around. If any of your floors are uneven, fix them. If there are any pets around you, be aware of where they are. Review your medicines with your doctor. Some medicines can make you feel dizzy. This can increase your chance of falling. Ask your doctor what other things that you can do to help prevent falls. This information is not intended to replace advice given to you by your health care provider. Make sure you discuss any questions you have with your health care provider. Document Released: 12/03/2008 Document Revised: 07/15/2015 Document Reviewed: 03/13/2014 Elsevier Interactive Patient Education  2017 [employer].

## 2022-06-23 ENCOUNTER — Other Ambulatory Visit: Payer: Self-pay | Admitting: Family Medicine

## 2022-06-23 DIAGNOSIS — L209 Atopic dermatitis, unspecified: Secondary | ICD-10-CM

## 2022-08-17 ENCOUNTER — Other Ambulatory Visit: Payer: Self-pay

## 2022-08-17 DIAGNOSIS — L209 Atopic dermatitis, unspecified: Secondary | ICD-10-CM

## 2022-08-20 MED ORDER — BETAMETHASONE VALERATE 0.1 % EX OINT: TOPICAL_OINTMENT | CUTANEOUS | 1 refills | Status: DC

## 2022-10-31 IMAGING — CT CT PELVIS W/O CM
2 of 4 series · 15 of 46 positions shown, 17 images · non-contrast
Comparison: No prior CT, correlation is made with same day hip
radiographs

CLINICAL DATA: Right hip pain, will not bear weight

EXAM:
CT PELVIS WITHOUT CONTRAST
TECHNIQUE: Multidetector CT imaging of the pelvis was performed following the
standard protocol without intravenous contrast.

[Series 4: axial st · axial · 0.63mm/px · z∈[-170,+18]mm · 12 of 108 slices shown, 14 images]
[im 7/108  soft-tissue]
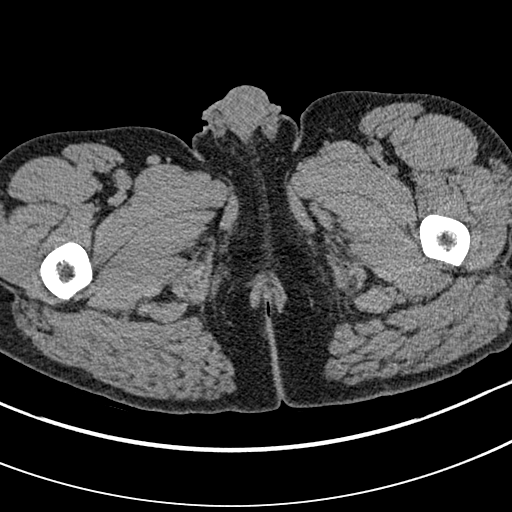
[im 7/108  bone]
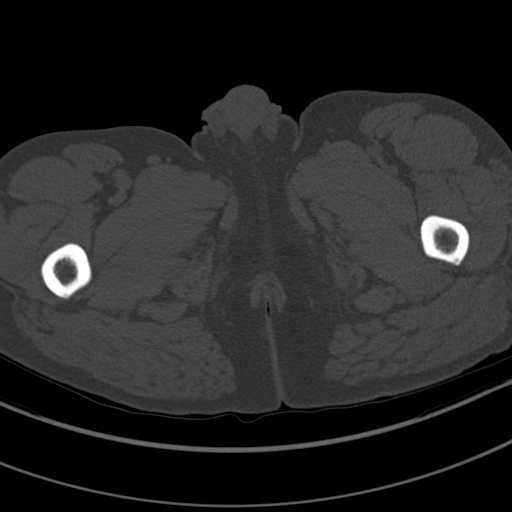
[im 14/108  soft-tissue]
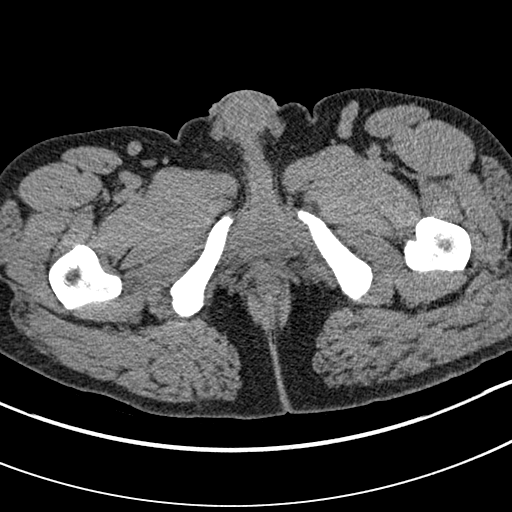
[im 25/108  soft-tissue]
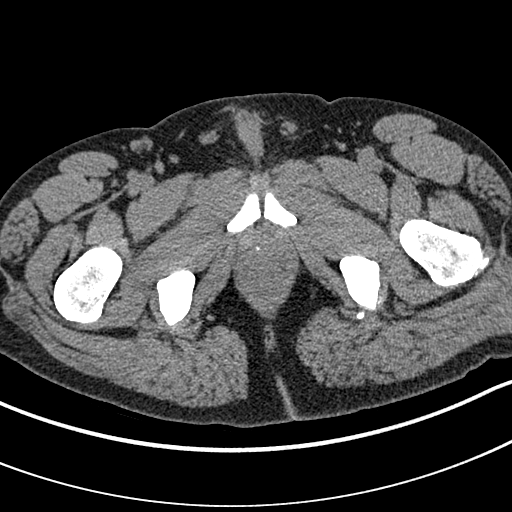
[im 32/108  soft-tissue]
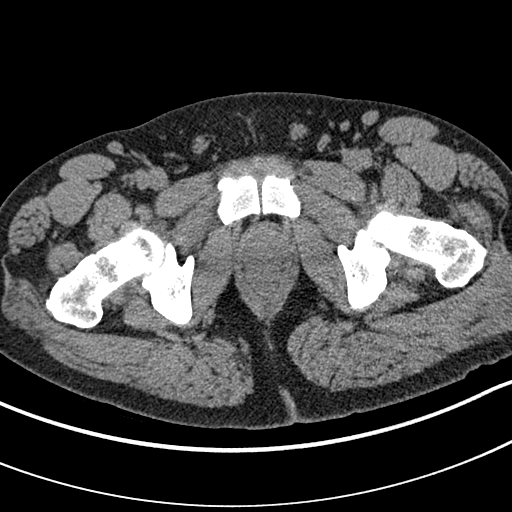
[im 42/108  soft-tissue]
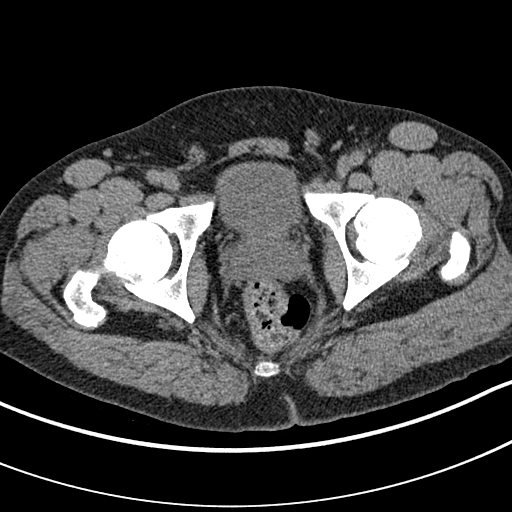
[im 49/108  soft-tissue]
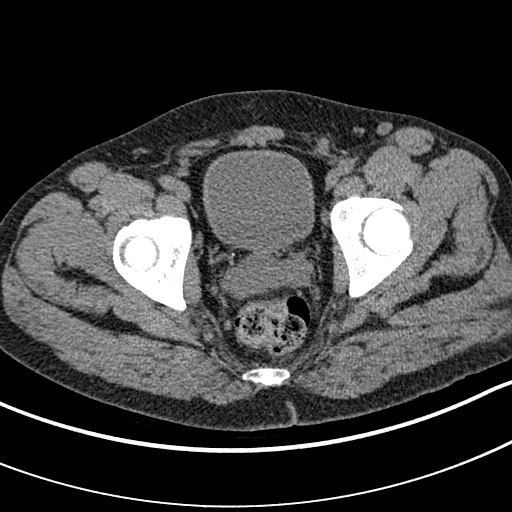
[im 59/108  soft-tissue]
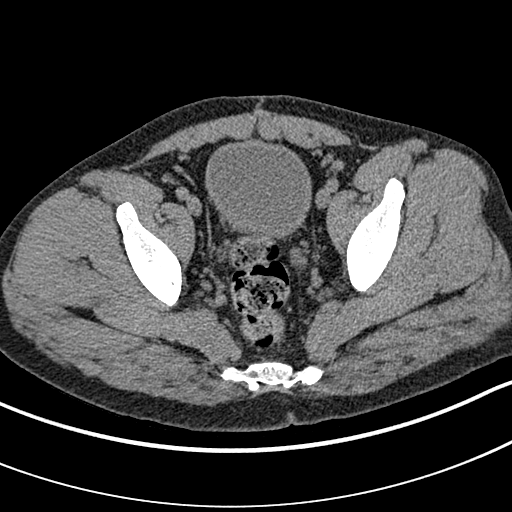
[im 66/108  soft-tissue]
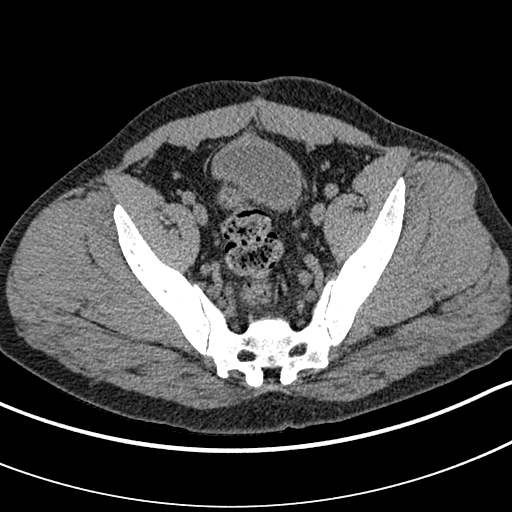
[im 76/108  soft-tissue]
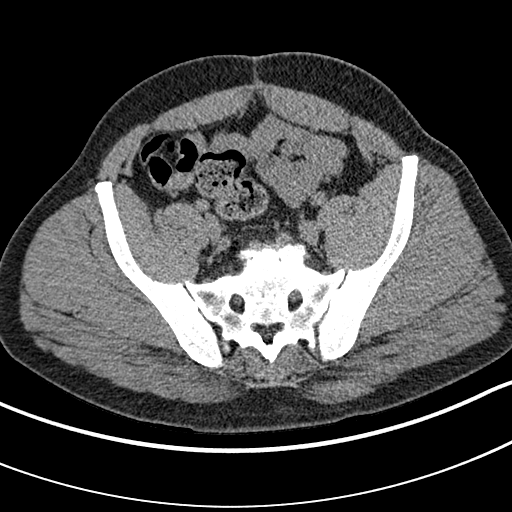
[im 76/108  bone]
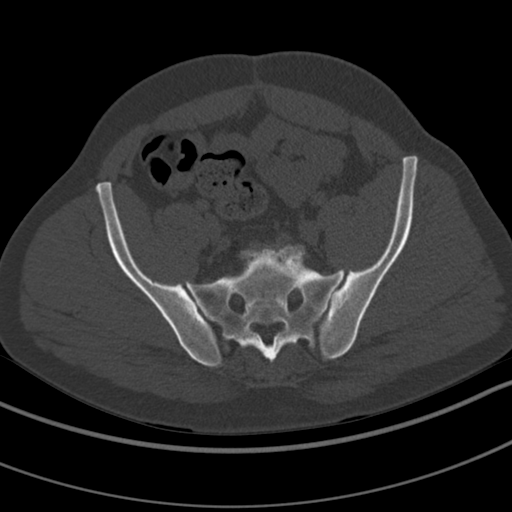
[im 83/108  soft-tissue]
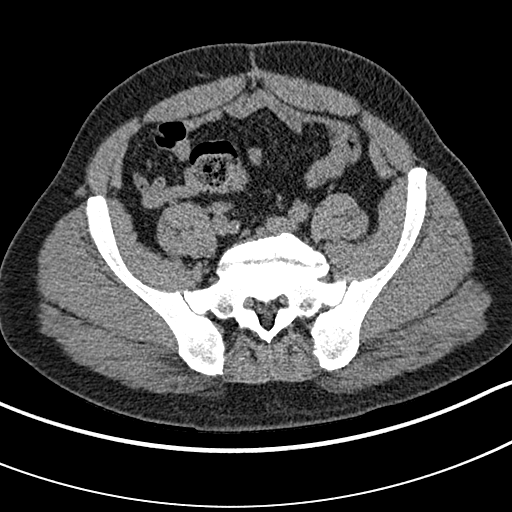
[im 94/108  soft-tissue]
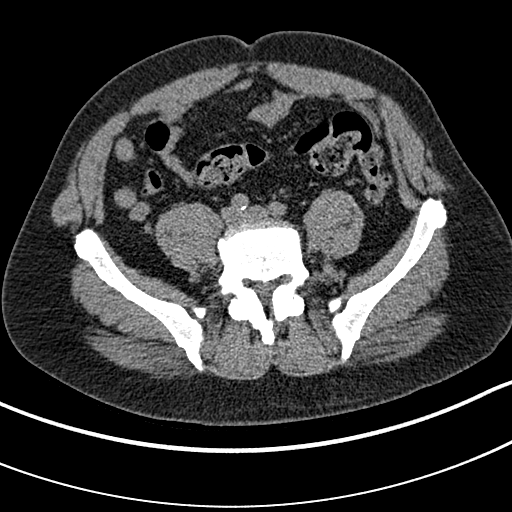
[im 101/108  soft-tissue]
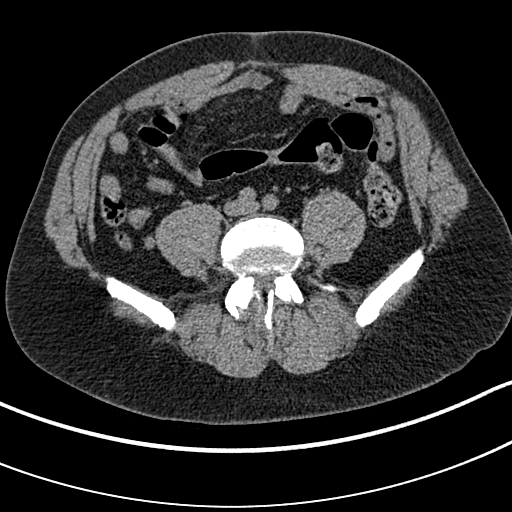

[Series 9: coronal st · coronal · 0.47mm/px · 3 of 132 slices shown]
[im 27/132  soft-tissue]
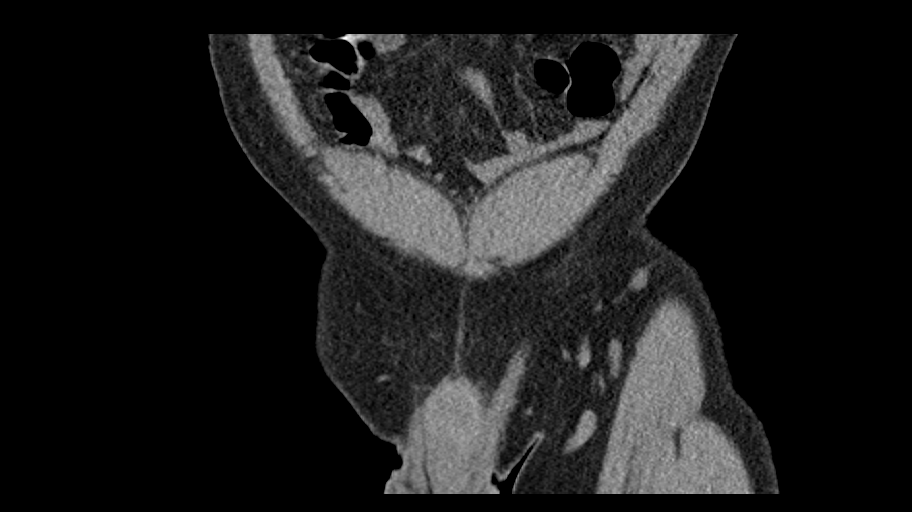
[im 53/132  soft-tissue]
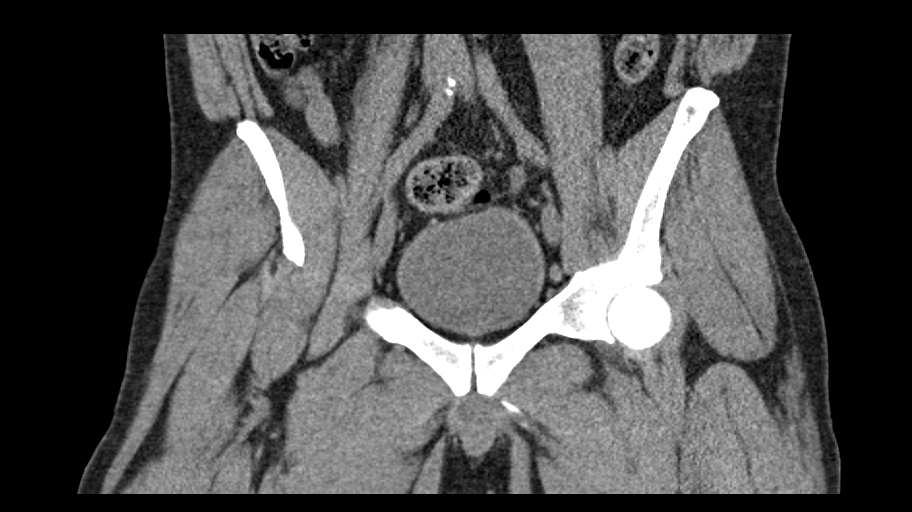
[im 79/132  soft-tissue]
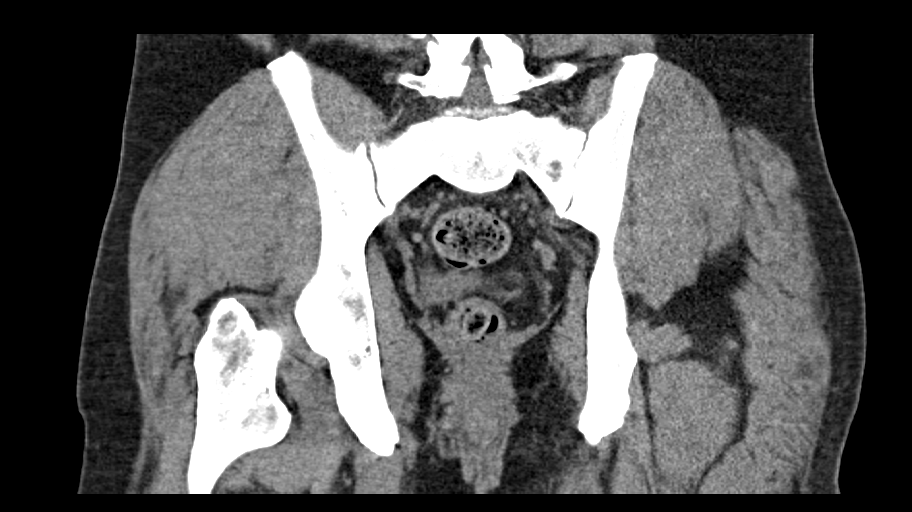

[15 of 46 positions shown; findings below may reference images not displayed]

FINDINGS: Urinary Tract:  No abnormality visualized.

Bowel:  Unremarkable visualized pelvic bowel loops.

Vascular/Lymphatic: No pathologically enlarged lymph nodes. No
significant vascular abnormality seen.

Reproductive:  The prostate is present.

Other: No free fluid or free air in the pelvis. Scattered
phleboliths.

Musculoskeletal: No acute osseous abnormality. Negative for
fracture. No suspicious lesions.
IMPRESSION: No acute process in the pelvis. No evidence of fracture or other
osseous abnormality.

## 2022-10-31 IMAGING — CR DG HIP (WITH OR WITHOUT PELVIS) 2-3V*R*
3 series · 3 of 3 positions shown · non-contrast
Comparison: No priors.

CLINICAL DATA: 55-year-old male with history of right-sided hip
pain after a fall.

EXAM:
DG HIP (WITH OR WITHOUT PELVIS) 2-3V RIGHT

[t pelvis ap]
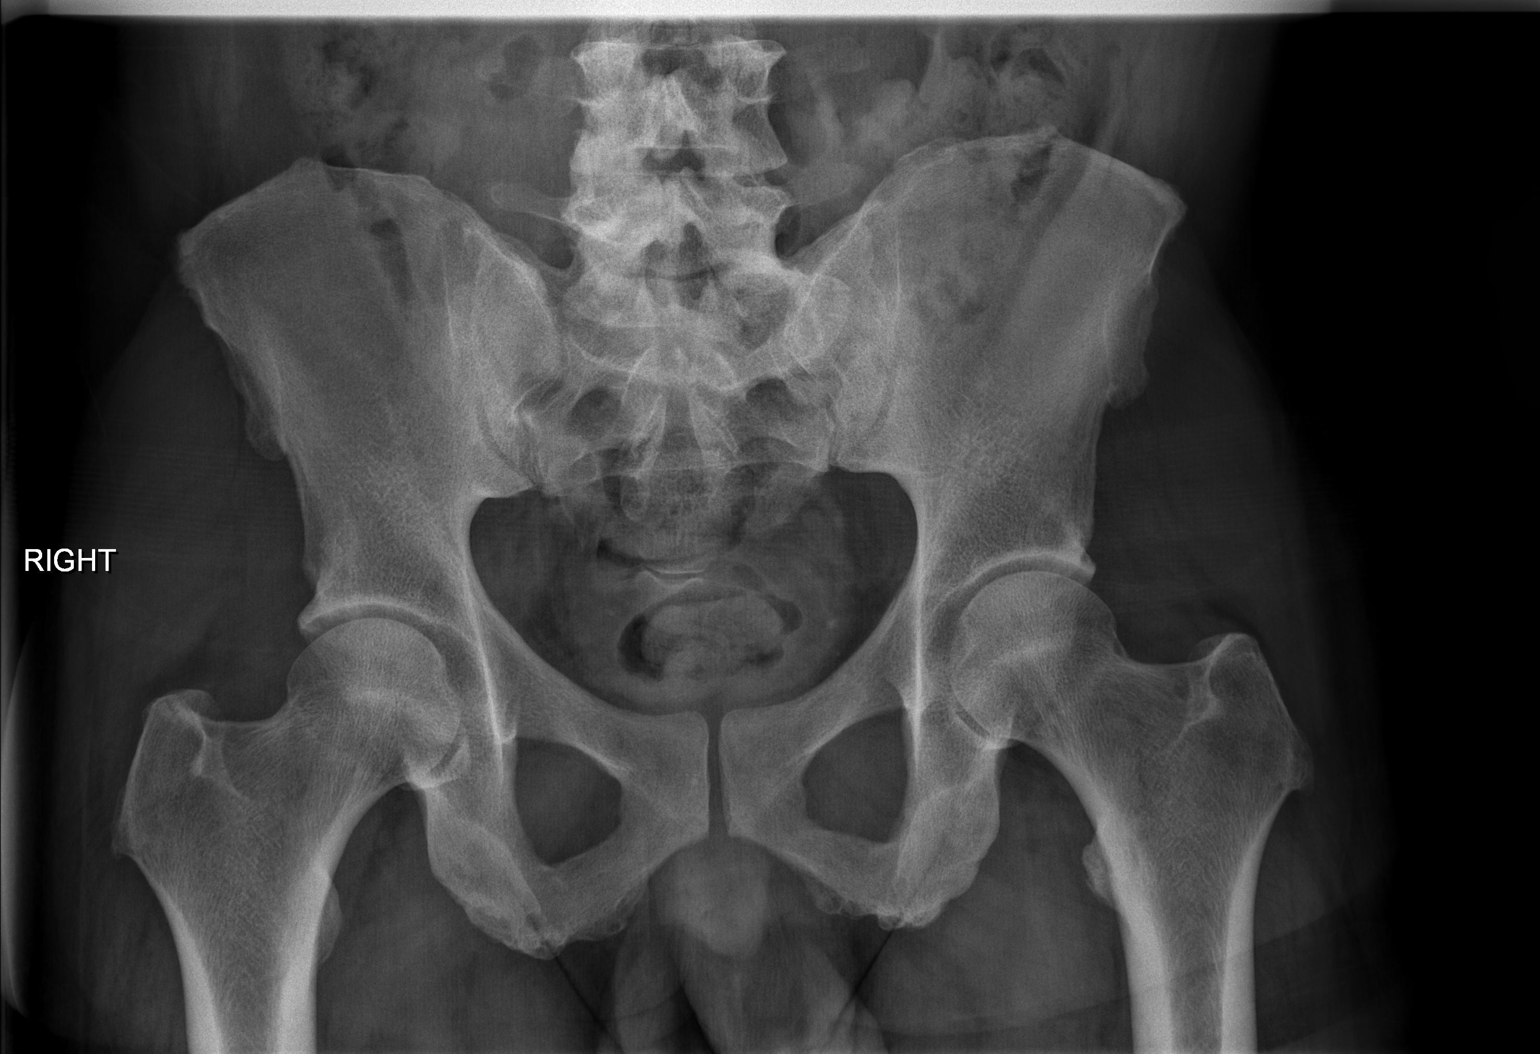

[t hip ap right]
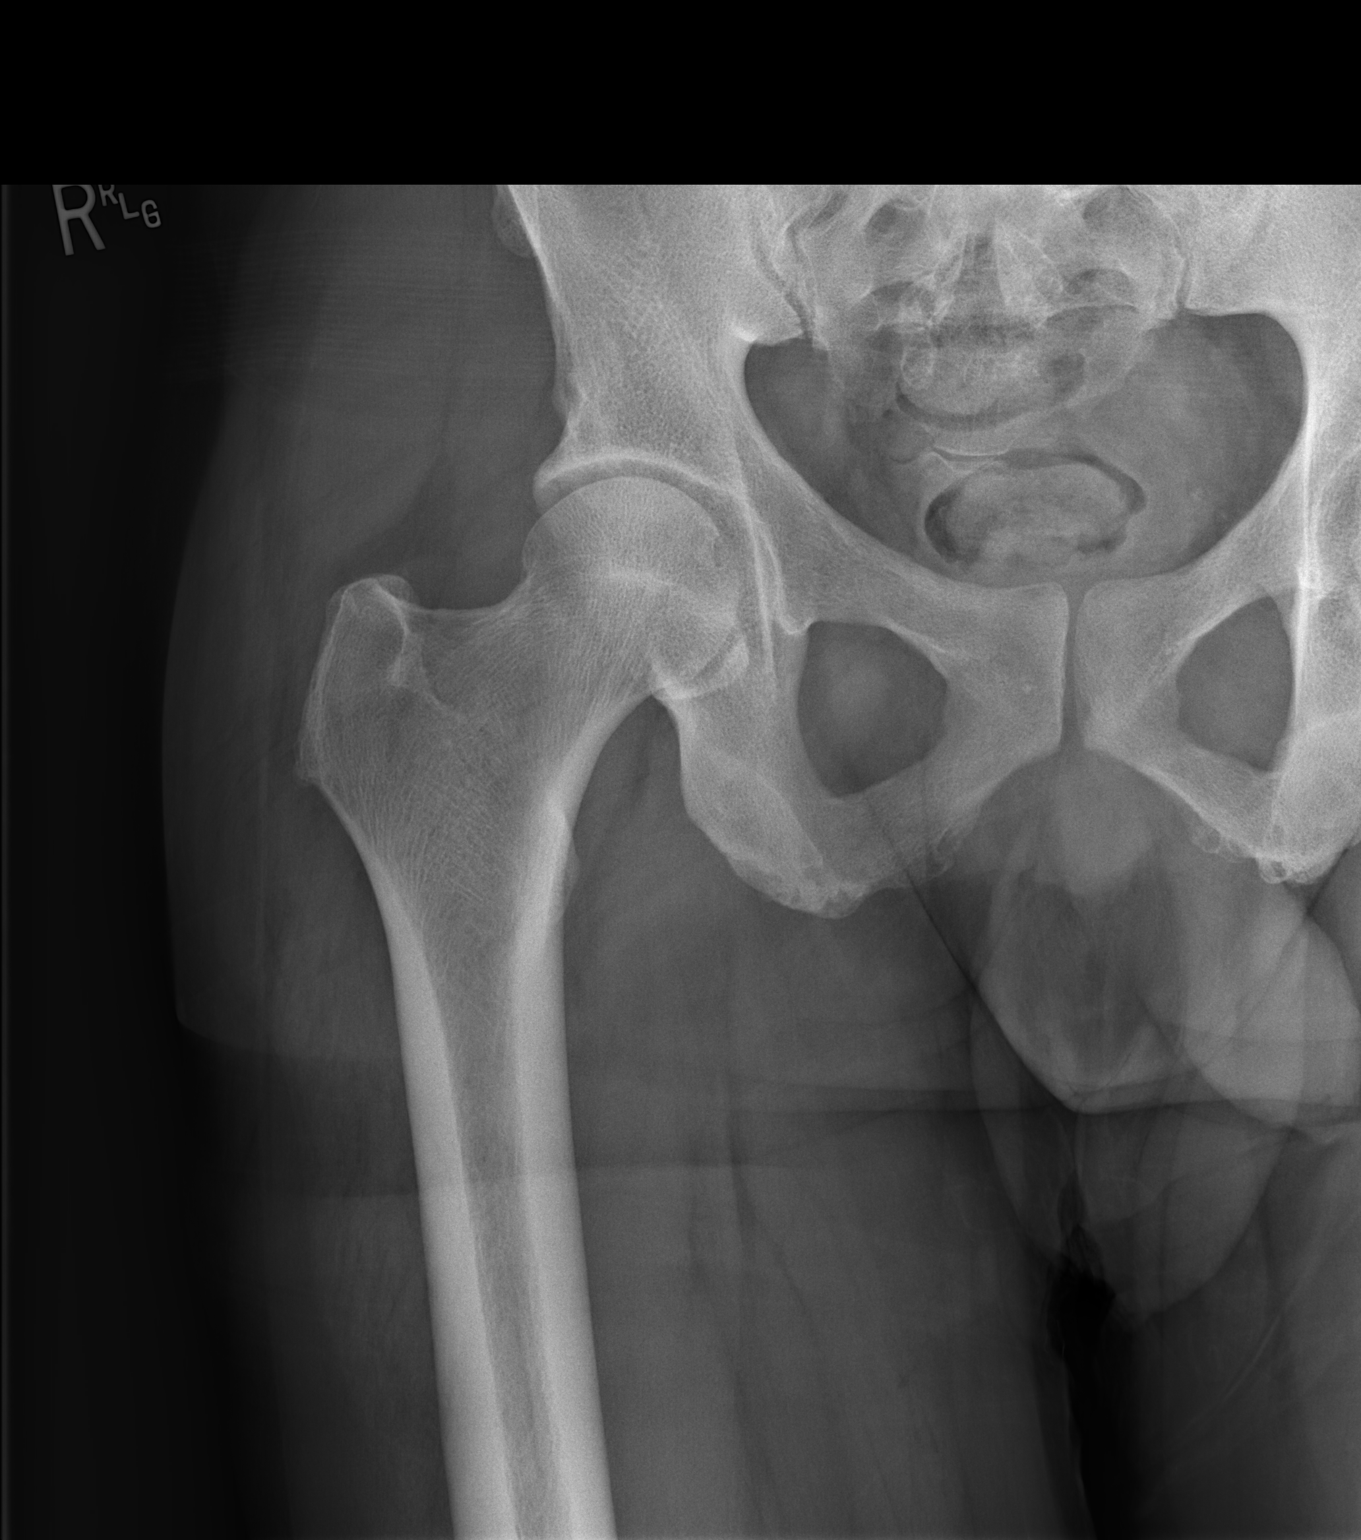

[t hip frog leg right]
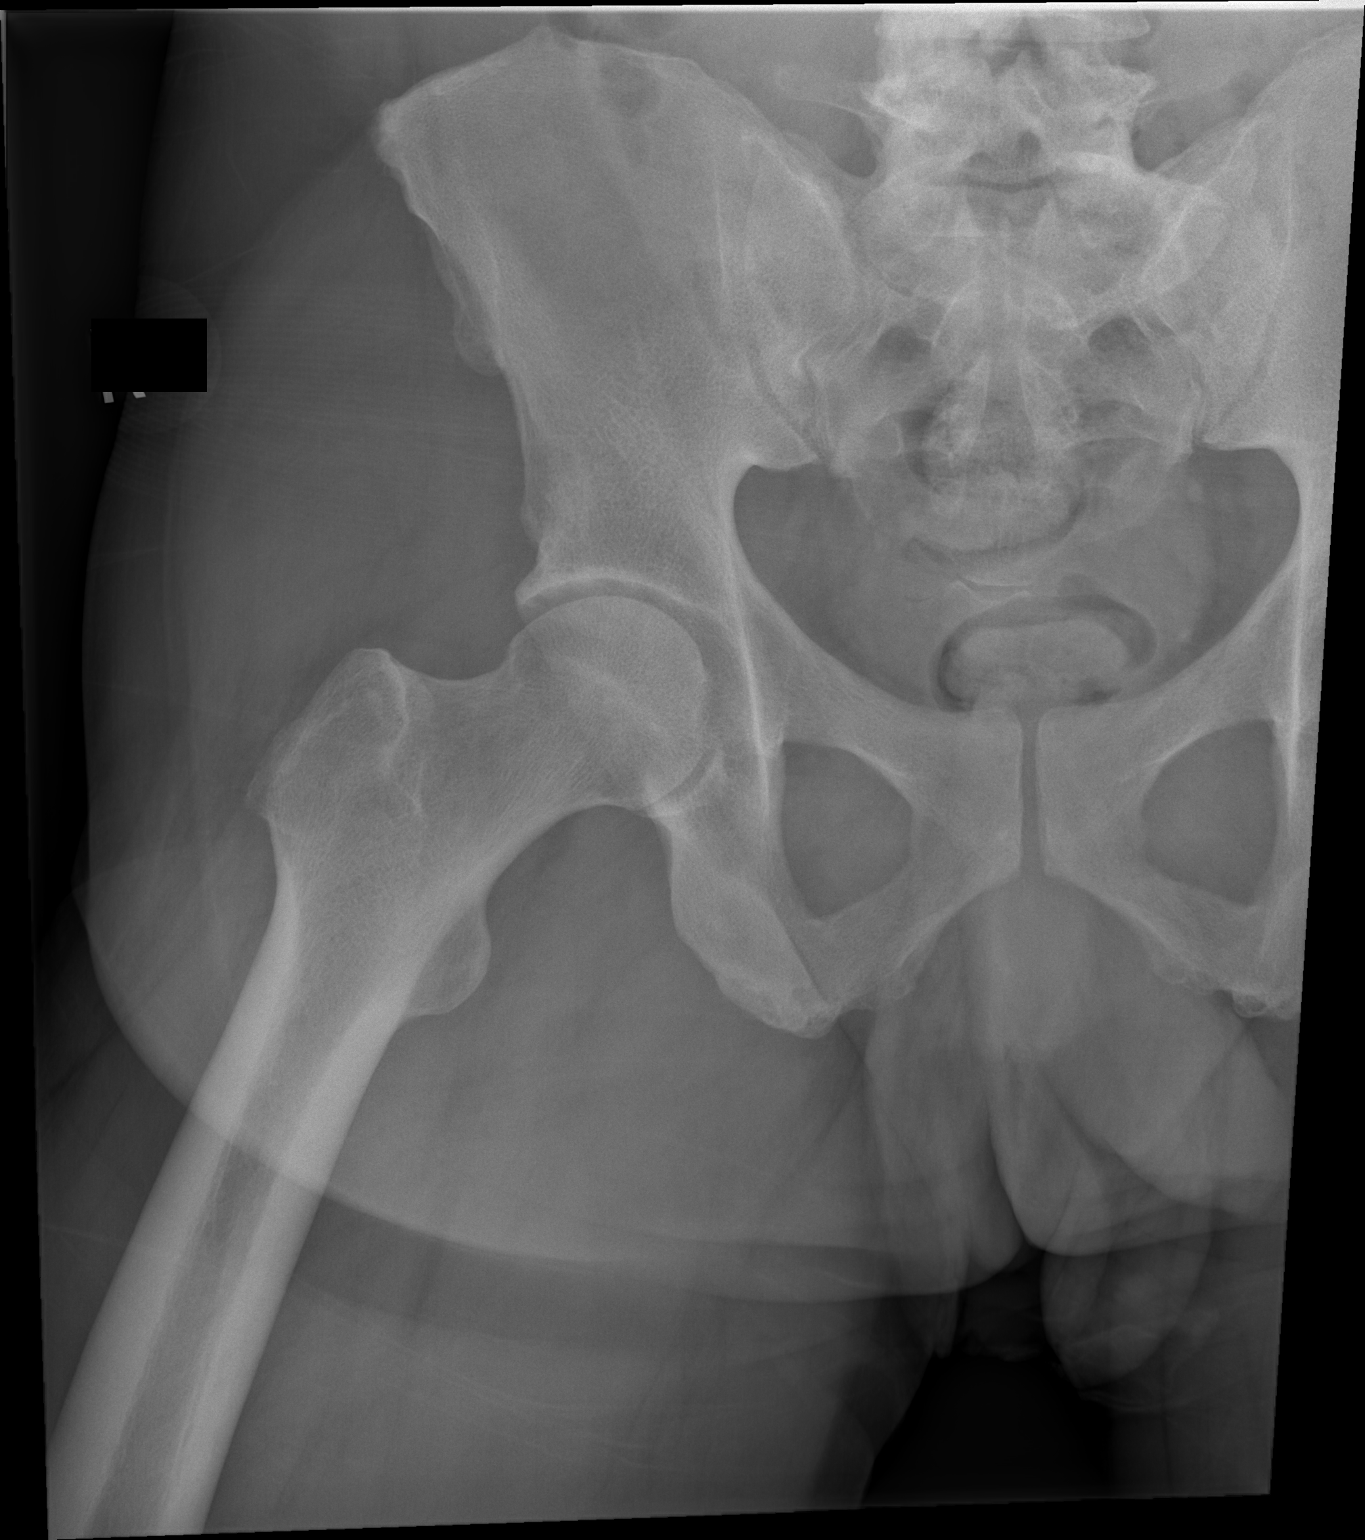

[3 of 3 positions shown; findings below may reference images not displayed]

FINDINGS: There is no evidence of hip fracture or dislocation. Mild joint
space narrowing, subchondral sclerosis, subchondral cyst formation
and osteophyte formation in the hip joints bilaterally, compatible
with osteoarthritis.
IMPRESSION: 1. No acute radiographic abnormality of the bony pelvis or right
hip.
2. Mild to moderate bilateral hip joint osteoarthritis.

## 2022-11-23 ENCOUNTER — Encounter: Payer: Medicare Other | Admitting: Family Medicine

## 2022-11-24 ENCOUNTER — Encounter: Payer: Medicare Other | Admitting: Family Medicine

## 2022-11-28 ENCOUNTER — Ambulatory Visit: Payer: Medicare Other | Admitting: Student

## 2022-11-28 VITALS — BP 167/79 | HR 74 | Temp 98.5°F | Ht 65.0 in | Wt 155.0 lb

## 2022-11-28 DIAGNOSIS — L209 Atopic dermatitis, unspecified: Secondary | ICD-10-CM

## 2022-11-28 DIAGNOSIS — Z13220 Encounter for screening for lipoid disorders: Secondary | ICD-10-CM

## 2022-11-28 DIAGNOSIS — Z0000 Encounter for general adult medical examination without abnormal findings: Secondary | ICD-10-CM

## 2022-11-28 DIAGNOSIS — Z23 Encounter for immunization: Secondary | ICD-10-CM

## 2022-11-28 DIAGNOSIS — Z1159 Encounter for screening for other viral diseases: Secondary | ICD-10-CM

## 2022-11-28 DIAGNOSIS — Z131 Encounter for screening for diabetes mellitus: Secondary | ICD-10-CM | POA: Diagnosis not present

## 2022-11-28 DIAGNOSIS — I10 Essential (primary) hypertension: Secondary | ICD-10-CM | POA: Diagnosis not present

## 2022-11-28 MED ORDER — AMLODIPINE BESYLATE 5 MG PO TABS: 5.0000 mg | ORAL_TABLET | Freq: Every day | ORAL | 3 refills | Status: DC

## 2022-11-28 MED ORDER — BETAMETHASONE VALERATE 0.1 % EX OINT: TOPICAL_OINTMENT | CUTANEOUS | 1 refills | Status: DC

## 2022-11-28 NOTE — Patient Instructions (Signed)
It was great to see you! Thank you for allowing me to participate in your care!  Our plans for today:  - Annual Exam It was good to see you today. We saw you for a yearly physical.   Schedule an appointment for a lab visit, to have your labs collected.  - High Blood Pressure  Your blood pressure is elevated, as you have been off your medication for a while.  Schedule an appointment to be seen in 2 weeks for a blood pressure check.  We are checking some labs, I will call you if they are abnormal will send you a MyChart message or a letter if they are normal.  If you do not hear about your labs in the next 2 weeks please let us know.  Take care and seek immediate care sooner if you develop any concerns.   Dr. Bess Kinds, MD [facility]

## 2022-11-28 NOTE — Progress Notes (Signed)
    SUBJECTIVE:   Chief compliant/HPI: annual examination  Lucille Crichlow is a 57 y.o. who presents today for an annual exam.   HTN Meds: Amlodipine 5 mg  Today: Patient's brother with him and visit today.  Notes that patient's been without his blood pressure medicine for months, as they ran out.   History tabs reviewed and updated.    OBJECTIVE:   BP (!) 167/79   Pulse 74   Temp 98.5 F (36.9 C)   Ht 5\' 5"  (1.651 m)   Wt 155 lb (70.3 kg)   SpO2 98%   BMI 25.79 kg/m   Physical Exam Constitutional:      General: He is not in acute distress.    Appearance: Normal appearance. He is normal weight. He is not ill-appearing.  Cardiovascular:     Rate and Rhythm: Normal rate and regular rhythm.     Pulses: Normal pulses.     Heart sounds: Normal heart sounds. No murmur heard.    No friction rub. No gallop.  Pulmonary:     Effort: Pulmonary effort is normal. No respiratory distress.     Breath sounds: Normal breath sounds. No stridor. No wheezing, rhonchi or rales.  Abdominal:     General: Abdomen is flat. There is no distension.     Palpations: Abdomen is soft. There is no mass.     Tenderness: There is no abdominal tenderness. There is no guarding.  Musculoskeletal:     Right lower leg: No edema.     Left lower leg: No edema.  Skin:    Capillary Refill: Capillary refill takes less than 2 seconds.  Neurological:     Mental Status: He is alert.      ASSESSMENT/PLAN:   Annual physical exam Patient comes in for annual physical exam with no complaints.  Patient due for cholesterol and diabetes screening.  Patient not sexually active at this no STI screening.  Patient also due for hep B screening.  Patient appreciates good mood, no signs of depression.  Patient blood pressure elevated however patient has been off blood pressure medicine for months now, as he ran out. - Hep B, A1c, lipid panel  Hypertension Patient blood pressure elevated however has been off meds for  months as he ran out.  Will refill meds and recommend close follow-up.  Patient on low-dose amlodipine, doubt this will provide sufficient blood pressure control.  Will also obtain BMP. - Continue amlodipine 5 mg daily - BMP - Follow-up 2 weeks    Annual Examination  See AVS for age appropriate recommendations.  PHQ score not done but denies any low mood, depression, or SI. Blood pressure value is not at goal, discussed that patient has been w/o meds for couple of months.   Considered the following screening exams based upon USPSTF recommendations: Diabetes screening: discussed and ordered Screening for elevated cholesterol: discussed and ordered HIV testing: discussed Neg 08/24/17 Hepatitis C: discussed Neg 11/04/19 Hepatitis B: discussed and ordered Syphilis if at high risk: discussed Reviewed risk factors for latent tuberculosis and not indicated Colorectal cancer screening: up to date on screening for CRC. Repeat 05/2024 Lung cancer screening: discussed and not indicated  See documentation below regarding discussion and indication.    Follow up in 1 year or sooner if indicated.    Bess Kinds, MD [facility]

## 2022-11-30 DIAGNOSIS — Z0000 Encounter for general adult medical examination without abnormal findings: Secondary | ICD-10-CM | POA: Insufficient documentation

## 2022-11-30 NOTE — Assessment & Plan Note (Signed)
Patient blood pressure elevated however has been off meds for months as he ran out.  Will refill meds and recommend close follow-up.  Patient on low-dose amlodipine, doubt this will provide sufficient blood pressure control.  Will also obtain BMP. - Continue amlodipine 5 mg daily - BMP - Follow-up 2 weeks

## 2022-11-30 NOTE — Assessment & Plan Note (Signed)
Patient comes in for annual physical exam with no complaints.  Patient due for cholesterol and diabetes screening.  Patient not sexually active at this no STI screening.  Patient also due for hep B screening.  Patient appreciates good mood, no signs of depression.  Patient blood pressure elevated however patient has been off blood pressure medicine for months now, as he ran out. - Hep B, A1c, lipid panel

## 2022-12-08 ENCOUNTER — Other Ambulatory Visit: Payer: Self-pay

## 2022-12-08 ENCOUNTER — Encounter: Payer: Self-pay | Admitting: Family Medicine

## 2022-12-08 ENCOUNTER — Ambulatory Visit (INDEPENDENT_AMBULATORY_CARE_PROVIDER_SITE_OTHER): Payer: Medicare Other | Admitting: Family Medicine

## 2022-12-08 DIAGNOSIS — Z131 Encounter for screening for diabetes mellitus: Secondary | ICD-10-CM

## 2022-12-08 DIAGNOSIS — Z13220 Encounter for screening for lipoid disorders: Secondary | ICD-10-CM | POA: Diagnosis not present

## 2022-12-08 DIAGNOSIS — Z1159 Encounter for screening for other viral diseases: Secondary | ICD-10-CM | POA: Diagnosis not present

## 2022-12-08 DIAGNOSIS — I10 Essential (primary) hypertension: Secondary | ICD-10-CM | POA: Diagnosis present

## 2022-12-08 MED ORDER — LOSARTAN POTASSIUM 25 MG PO TABS
25.0000 mg | ORAL_TABLET | Freq: Every day | ORAL | 3 refills | Status: DC
Start: 2022-12-08 — End: 2023-11-15

## 2022-12-08 NOTE — Progress Notes (Unsigned)
    SUBJECTIVE:   CHIEF COMPLAINT / HPI:   Hypertension  Patient has been taking amlodipine 5 mg daily.  Denies any side effects.  Has not checked blood pressure at home.  Healthcare maintenance Patient has not had a physical in quite some time.  Patient does some walking daily.  And lives with his brother.  Eats some variety of foods though is picky. Follows with gastroenterology for history of colon cancer.  Had a colonoscopy 1 year ago and pathology was not concerning did not mention any recommendations for follow-up.  PERTINENT  PMH / PSH: Hypertension, developmental delay,  OBJECTIVE:   BP (!) 161/70   Pulse 95   Ht 5\' 5"  (1.651 m)   Wt 151 lb 12.8 oz (68.9 kg)   SpO2 100%   BMI 25.26 kg/m   General: well appearing, in no acute distress, nonverbal/few words/sounds CV: RRR, radial pulses equal and palpable, no BLE edema  Resp: Normal work of breathing on room air, CTAB Abd: Soft, non tender, non distended  Psych: Pleasant   ASSESSMENT/PLAN:   Assessment & Plan Hypertension, unspecified type Uncontrolled at this time.  Even history of hypertension as well as level of elevation of blood pressure we will opt to do a 2 medication regimen. - Continue amlodipine 5 mg - Start losartan 25 mg - BMP Screening for hyperlipidemia Lipid panel today Screening for diabetes mellitus At risk for diabetes mellitus given elevated BMI. A1c today  Made appointment for physical.  Recommended that patient's caregiver reach out to gastroenterology regarding their recommendations for follow-up.    Lockie Mola, MD [facility]

## 2022-12-08 NOTE — Patient Instructions (Signed)
It was wonderful to see you today.  Please bring ALL of your medications with you to every visit.   Today we talked about:  Blood pressure - We are going to start a medication called losartan today and get some labs. We will need to get some more labs in a month and follow up your blood pressure   Please follow up in 1 month  Thank you for choosing [facility].   Please call [phone] with any questions about today's appointment.  Please be sure to schedule follow up at the front desk before you leave today.   Lockie Mola, MD  Family Medicine

## 2022-12-09 LAB — BASIC METABOLIC PANEL
BUN/Creatinine Ratio: 17 (ref 9–20)
BUN: 16 mg/dL (ref 6–24)
CO2: 20 mmol/L (ref 20–29)
Calcium: 9.9 mg/dL (ref 8.7–10.2)
Chloride: 105 mmol/L (ref 96–106)
Creatinine, Ser: 0.95 mg/dL (ref 0.76–1.27)
Glucose: 106 mg/dL — ABNORMAL HIGH (ref 70–99)
Potassium: 4 mmol/L (ref 3.5–5.2)
Sodium: 143 mmol/L (ref 134–144)
eGFR: 95 mL/min/{1.73_m2} (ref >59)

## 2022-12-09 LAB — LIPID PANEL
Chol/HDL Ratio: 5.1 {ratio} — ABNORMAL HIGH (ref 0.0–5.0)
Cholesterol, Total: 195 mg/dL (ref 100–199)
HDL: 38 mg/dL — ABNORMAL LOW (ref >39)
LDL Chol Calc (NIH): 125 mg/dL — ABNORMAL HIGH (ref 0–99)
Triglycerides: 177 mg/dL — ABNORMAL HIGH (ref 0–149)
VLDL Cholesterol Cal: 32 mg/dL (ref 5–40)

## 2022-12-09 LAB — HEMOGLOBIN A1C
Est. average glucose Bld gHb Est-mCnc: 120 mg/dL
Hgb A1c MFr Bld: 5.8 % — ABNORMAL HIGH (ref 4.8–5.6)

## 2022-12-09 LAB — HEPATITIS B SURFACE ANTIGEN: Hepatitis B Surface Ag: NEGATIVE

## 2022-12-11 NOTE — Assessment & Plan Note (Signed)
Uncontrolled at this time.  Even history of hypertension as well as level of elevation of blood pressure we will opt to do a 2 medication regimen. - Continue amlodipine 5 mg - Start losartan 25 mg - BMP

## 2023-01-10 ENCOUNTER — Encounter: Payer: Self-pay | Admitting: Family Medicine

## 2023-01-10 ENCOUNTER — Ambulatory Visit (INDEPENDENT_AMBULATORY_CARE_PROVIDER_SITE_OTHER): Payer: Medicare Other | Admitting: Family Medicine

## 2023-01-10 VITALS — BP 143/67 | HR 93 | Ht 65.0 in | Wt 152.0 lb

## 2023-01-10 DIAGNOSIS — Z23 Encounter for immunization: Secondary | ICD-10-CM | POA: Diagnosis not present

## 2023-01-10 DIAGNOSIS — E782 Mixed hyperlipidemia: Secondary | ICD-10-CM | POA: Diagnosis not present

## 2023-01-10 DIAGNOSIS — I10 Essential (primary) hypertension: Secondary | ICD-10-CM

## 2023-01-10 MED ORDER — ATORVASTATIN CALCIUM 10 MG PO TABS: 10.0000 mg | ORAL_TABLET | Freq: Every day | ORAL | 3 refills | Status: DC

## 2023-01-10 MED ORDER — AMLODIPINE BESYLATE 10 MG PO TABS: 10.0000 mg | ORAL_TABLET | Freq: Every day | ORAL | 0 refills | Status: DC

## 2023-01-10 NOTE — Progress Notes (Signed)
    SUBJECTIVE:   CHIEF COMPLAINT / HPI:   Hypertension F/u  Patient reports taking his medication daily. Denies any new side effects from the addition of losartan at last visit. Denies headaches, chest pain, shortness of breath. Denies leg swelling. He has not been using his stationary bike as it was causing back pain. Does not walk much either. Dad wants patient to walk and exercise more. Dad helps patient with his medications.    PERTINENT  PMH / PSH: Hypertension, Prediabetes, Hypertriglyceridemia   OBJECTIVE:   BP (!) 143/67   Pulse 93   Ht 5\' 5"  (1.651 m)   Wt 152 lb (68.9 kg)   SpO2 99%   BMI 25.29 kg/m   General: well appearing, in no acute distress, intellectually delayed  CV: RRR, radial pulses equal and palpable, no BLE edema  Resp: Normal work of breathing on room air, CTAB Abd: Soft, non tender, non distended  Neuro: Alert & Oriented x 4    ASSESSMENT/PLAN:   Assessment & Plan Hypertension, unspecified type Uncontrolled at this time despite addition of losartan. Will increase amlodipine to 10 mg and continue losartan at 25 mg.  - BMP today  - UACR  - Follow up in 4 weeks for BP check and BMP  Encounter for immunization Due for shingrix.  - Counseled and recommended getting the vaccine at the pharmacy.  Hypercholesterolemia with hypertriglyceridemia Patient with elevated LDL and triglycerides. Given HTN, Prediabetes, and BMI patient is at increased CVD risk with 10.6% CVD risk in 10 years.  - Starting atorvastatin 10 mg      Lockie Mola, MD [facility]

## 2023-01-10 NOTE — Patient Instructions (Signed)
It was wonderful to see you today.  Please bring ALL of your medications with you to every visit.   Today we talked about:  Blood pressure - Blood pressure is still elevated so I increased the amlodipine from 5 mg to 10 mg. Continue the losartan at 25 mg. We will be getting some labs today to check on kidney function.   Lucky is also due for a shingles vaccine which he can get at the pharmacy.   We are starting a medication called atorvastatin to prevent heart disease.   Thank you for choosing [facility].   Please call [phone] with any questions about today's appointment.  Lockie Mola, MD  Family Medicine    Blood Pressure Record Sheet To take your blood pressure, you will need a blood pressure machine. You can buy a blood pressure machine (blood pressure monitor) at your clinic, drug store, or online. When choosing one, consider: An automatic monitor that has an arm cuff. A cuff that wraps snugly around your upper arm. You should be able to fit only one finger between your arm and the cuff. A device that stores blood pressure reading results. Do not choose a monitor that measures your blood pressure from your wrist or finger. Follow your health care provider's instructions for how to take your blood pressure. To use this form: Take your blood pressure medications every day These measurements should be taken when you have been at rest for at least 10-15 min Take at least 2 readings with each blood pressure check. This makes sure the results are correct. Wait 1-2 minutes between measurements. Write down the results in the spaces on this form. Keep in mind it should always be recorded systolic over diastolic. Both numbers are important.  Repeat this every day for 2-3 weeks, or as told by your health care provider.  Make a follow-up appointment with your health care provider to discuss the results.  Blood Pressure Log Date Medications taken? (Y/N) Blood  Pressure Time of Day

## 2023-01-10 NOTE — Assessment & Plan Note (Addendum)
Uncontrolled at this time despite addition of losartan. Will increase amlodipine to 10 mg and continue losartan at 25 mg.  - BMP today  - UACR  - Follow up in 4 weeks for BP check and BMP

## 2023-01-11 LAB — MICROALBUMIN / CREATININE URINE RATIO
Creatinine, Urine: 127.5 mg/dL
Microalb/Creat Ratio: 21 mg/g{creat} (ref 0–29)
Microalbumin, Urine: 26.8 ug/mL

## 2023-01-11 LAB — BASIC METABOLIC PANEL
BUN/Creatinine Ratio: 14 (ref 9–20)
BUN: 14 mg/dL (ref 6–24)
CO2: 24 mmol/L (ref 20–29)
Calcium: 9.9 mg/dL (ref 8.7–10.2)
Chloride: 102 mmol/L (ref 96–106)
Creatinine, Ser: 1.01 mg/dL (ref 0.76–1.27)
Glucose: 116 mg/dL — ABNORMAL HIGH (ref 70–99)
Potassium: 4 mmol/L (ref 3.5–5.2)
Sodium: 139 mmol/L (ref 134–144)
eGFR: 88 mL/min/{1.73_m2} (ref >59)

## 2023-01-22 ENCOUNTER — Encounter: Payer: Self-pay | Admitting: Family Medicine

## 2023-02-02 ENCOUNTER — Ambulatory Visit (INDEPENDENT_AMBULATORY_CARE_PROVIDER_SITE_OTHER): Payer: Medicare Other | Admitting: Family Medicine

## 2023-02-02 VITALS — BP 127/56 | HR 90 | Ht 65.0 in | Wt 151.6 lb

## 2023-02-02 DIAGNOSIS — I10 Essential (primary) hypertension: Secondary | ICD-10-CM

## 2023-02-02 NOTE — Patient Instructions (Signed)
It was wonderful to see you today.  Please bring ALL of your medications with you to every visit.   Today we talked about:  Work on keeping a consistent schedule for your medications. Keep them by something you use everyday like a toothbrush.   You have a blood pressure nurse visit on 12/27 at 9am.   Thank you for choosing [facility].   Please call [phone] with any questions about today's appointment.  Lockie Mola, MD  Family Medicine

## 2023-02-02 NOTE — Progress Notes (Unsigned)
    SUBJECTIVE:   CHIEF COMPLAINT / HPI:   F/u Hypertension  Last visit on 11/20, amlodipine 10 mg was added to hypertension regimen (losartan 25 mg) as patient still had elevated pressures. Dad helps patient with his medication and says that patient has been forgetting to take his medications. In the last week they estimate he has taken his medications 3/7 days   PERTINENT  PMH / PSH: HTN, HLD, Prediabetes   OBJECTIVE:   There were no vitals taken for this visit.  General: well appearing, in no acute distress CV: RRR, radial pulses equal and palpable, no BLE edema  Resp: Normal work of breathing on room air, CTAB Abd: Soft, non tender, non distended  Neuro: Alert & Oriented x 4    ASSESSMENT/PLAN:   There are no diagnoses linked to this encounter.    Bryan Mola, MD [facility]

## 2023-02-03 ENCOUNTER — Other Ambulatory Visit: Payer: Self-pay | Admitting: Family Medicine

## 2023-02-03 DIAGNOSIS — I10 Essential (primary) hypertension: Secondary | ICD-10-CM

## 2023-02-04 NOTE — Assessment & Plan Note (Signed)
Improved control from prior. Difficult to get an accurate evaluation of the blood pressure as there has not been consistent use of the medication.  - Counseled on methods to improve adherence  - F/u BP at nurse visit in 1 week  - F/u in 2 months

## 2023-02-16 ENCOUNTER — Ambulatory Visit: Payer: Medicare Other

## 2023-02-16 VITALS — BP 116/58 | HR 88

## 2023-02-16 DIAGNOSIS — I10 Essential (primary) hypertension: Secondary | ICD-10-CM

## 2023-02-16 NOTE — Progress Notes (Signed)
Patient here today for BP check.      Last BP was on 02/02/2023 and was 127/56.  BP today is 116/58 with a pulse of 88.   Checked BP in left arm with large cuff.    Symptoms present: None.   Patient last took BP med Amlodipine 10mg  and Losartan 25mg  at bedtime last night.   Routed note to PCP.

## 2023-05-28 ENCOUNTER — Ambulatory Visit (INDEPENDENT_AMBULATORY_CARE_PROVIDER_SITE_OTHER): Payer: Medicare Other

## 2023-05-28 VITALS — Ht 65.0 in | Wt 155.0 lb

## 2023-05-28 DIAGNOSIS — Z0000 Encounter for general adult medical examination without abnormal findings: Secondary | ICD-10-CM | POA: Diagnosis not present

## 2023-05-28 NOTE — Patient Instructions (Addendum)
 Mr. Bryan Ortiz , Thank you for taking time to come for your Medicare Wellness Visit. I appreciate your ongoing commitment to your health goals. Please review the following plan we discussed and let me know if I can assist you in the future.   Referrals/Orders/Follow-Ups/Clinician Recommendations: Keep maintaining your health by keeping your appointments with Dr. Marsh Dolly and any specialists that you may see.  Call us if you need anything.  Have a great year!!!!  This is a list of the screening recommended for you and due dates:  Health Maintenance  Topic Date Due   Zoster (Shingles) Vaccine (1 of 2) Never done   COVID-19 Vaccine (5 - Pfizer risk 2024-25 season) 05/29/2023   Flu Shot  09/21/2023   Medicare Annual Wellness Visit  05/27/2024   Colon Cancer Screening  06/14/2024   DTaP/Tdap/Td vaccine (2 - Td or Tdap) 08/25/2027   Hepatitis C Screening  Completed   HIV Screening  Completed   HPV Vaccine  Aged Out    Advanced directives: (Copy Requested) Please bring a copy of your health care power of attorney and living will to the office to be added to your chart at your convenience. You can mail to [facility] [address] [place], [place] [address] or email to [email or web address]  Next Medicare Annual Wellness Visit scheduled for next year: Yes

## 2023-05-28 NOTE — Progress Notes (Signed)
 Because this visit was a virtual/telehealth visit,  certain criteria was not obtained, such a blood pressure, CBG if applicable, and timed get up and go. Any medications not marked as "taking" were not mentioned during the medication reconciliation part of the visit. Any vitals not documented were not able to be obtained due to this being a telehealth visit or patient was unable to self-report a recent blood pressure reading due to a lack of equipment at home via telehealth. Vitals that have been documented are verbally provided by the patient.   Subjective:   Bryan Ortiz is a 58 y.o. who presents for a Medicare Wellness preventive visit.  Visit Complete: Virtual I connected with  Bryan Ortiz on 05/28/23 by a audio enabled telemedicine application and verified that I am speaking with the correct person using two identifiers.  Patient Location: Home  Provider Location: Office/Clinic  I discussed the limitations of evaluation and management by telemedicine. The patient expressed understanding and agreed to proceed.  Vital Signs: Because this visit was a virtual/telehealth visit, some criteria may be missing or patient reported. Any vitals not documented were not able to be obtained and vitals that have been documented are patient reported.  VideoDeclined- This patient declined Librarian, academic. Therefore the visit was completed with audio only.  Persons Participating in Visit: Patient assisted by brother, Bryan Ortiz (per DPR).  AWV Questionnaire: No: Patient Medicare AWV questionnaire was not completed prior to this visit.  Cardiac Risk Factors include: advanced age (>92men, >69 women);sedentary lifestyle;family history of premature cardiovascular disease;hypertension;male gender     Objective:    Today's Vitals   05/28/23 1032  Weight: 155 lb (70.3 kg)  Height: 5\' 5"  (1.651 m)  PainSc: 0-No pain   Body mass index is 25.79 kg/m.     05/28/2023    10:34 AM 11/28/2022    5:16 PM 05/02/2022    1:46 PM 10/17/2021    2:11 PM 11/16/2020    2:49 PM 11/04/2019    9:00 AM 02/25/2019    2:07 PM  Advanced Directives  Does Patient Have a Medical Advance Directive? Yes No No No No No No  Type of Estate agent of [place];Living will        Copy of Healthcare Power of Attorney in Chart? No - copy requested        Would patient like information on creating a medical advance directive?   No - Patient declined No - Patient declined No - Patient declined No - Guardian declined No - Patient declined    Current Medications (verified) Outpatient Encounter Medications as of 05/28/2023  Medication Sig   amLODipine (NORVASC) 10 MG tablet TAKE 1 TABLET BY MOUTH EVERYDAY AT BEDTIME   atorvastatin (LIPITOR) 10 MG tablet Take 1 tablet (10 mg total) by mouth daily.   betamethasone valerate ointment (VALISONE) 0.1 % APPLY TO AFFECTED AREA TWICE A DAY DURING A FLARE, APPLY CREAM LIKE AQUAFOR FIRST AND THEN APPLY OINTMENT   losartan (COZAAR) 25 MG tablet Take 1 tablet (25 mg total) by mouth at bedtime.   No facility-administered encounter medications on file as of 05/28/2023.    Allergies (verified) Patient has no known allergies.   History: Past Medical History:  Diagnosis Date   Colon cancer (HCC)    Developmental delay    On disability   Eczema    Hypertension    Past Surgical History:  Procedure Laterality Date   COLON SURGERY     Family  History  Problem Relation Age of Onset   Stroke Father    Hypertension Father    Kidney disease Sister    Diabetes Sister    Stroke Brother    Colon cancer Neg Hx    Esophageal cancer Neg Hx    Rectal cancer Neg Hx    Stomach cancer Neg Hx    Social History   Socioeconomic History   Marital status: Single    Spouse name: Not on file   Number of children: Not on file   Years of education: Not on file   Highest education level: Not on file  Occupational History   Occupation: Disabled   Tobacco Use   Smoking status: Never   Smokeless tobacco: Never  Vaping Use   Vaping status: Never Used  Substance and Sexual Activity   Alcohol use: Never   Drug use: Never   Sexual activity: Never    Birth control/protection: None  Other Topics Concern   Not on file  Social History Narrative   Lives with Brother, Antolin Belsito, primary care giver.    Social Drivers of Corporate investment banker Strain: Low Risk  (05/28/2023)   Overall Financial Resource Strain (CARDIA)    Difficulty of Paying Living Expenses: Not hard at all  Food Insecurity: No Food Insecurity (05/28/2023)   Hunger Vital Sign    Worried About Running Out of Food in the Last Year: Never true    Ran Out of Food in the Last Year: Never true  Transportation Needs: No Transportation Needs (05/28/2023)   PRAPARE - Administrator, Civil Service (Medical): No    Lack of Transportation (Non-Medical): No  Physical Activity: Insufficiently Active (05/28/2023)   Exercise Vital Sign    Days of Exercise per Week: 3 days    Minutes of Exercise per Session: 30 min  Stress: No Stress Concern Present (05/28/2023)   [employer] of Occupational Health - Occupational Stress Questionnaire    Feeling of Stress : Not at all  Social Connections: Socially Isolated (05/28/2023)   Social Connection and Isolation Panel [NHANES]    Frequency of Communication with Friends and Family: More than three times a week    Frequency of Social Gatherings with Friends and Family: More than three times a week    Attends Religious Services: Never    Database administrator or Organizations: No    Attends Engineer, structural: Never    Marital Status: Never married    Tobacco Counseling Counseling given: Not Answered    Clinical Intake:  Pre-visit preparation completed: Yes  Pain : No/denies pain Pain Score: 0-No pain     BMI - recorded: 25.79 Nutritional Status: BMI 25 -29 Overweight Nutritional Risks:  None Diabetes: No  Lab Results  Component Value Date   HGBA1C 5.8 (H) 12/08/2022   HGBA1C 5.5 11/16/2020   HGBA1C 5.6 10/23/2018     How often do you need to have someone help you when you read instructions, pamphlets, or other written materials from your doctor or pharmacy?: 1 - Never What is the last grade level you completed in school?: N/A     Information entered by :: Nyshawn Gowdy N. Mani Celestin, LPN.   Activities of Daily Living     05/28/2023   10:36 AM  In your present state of health, do you have any difficulty performing the following activities:  Hearing? 0  Vision? 0  Difficulty concentrating or making decisions? 1  Walking or climbing  stairs? 0  Dressing or bathing? 0  Doing errands, shopping? 0  Preparing Food and eating ? N  Using the Toilet? N  In the past six months, have you accidently leaked urine? N  Do you have problems with loss of bowel control? N  Managing your Medications? N  Managing your Finances? N  Housekeeping or managing your Housekeeping? N    Patient Care Team: Lockie Mola, MD as PCP - General (Family Medicine)  Indicate any recent Medical Services you may have received from other than [facility] providers in the past year (date may be approximate).     Assessment:   This is a routine wellness examination for [place].  Hearing/Vision screen Hearing Screening - Comments:: Denies hearing difficulties.  Vision Screening - Comments:: No issues with vision; no eyeglasses.   Goals Addressed             This Visit's Progress    Client understands the importance of follow-up with providers by attending scheduled visits         Depression Screen     05/28/2023   10:35 AM 02/02/2023    8:48 AM 01/10/2023    9:35 AM 12/08/2022   11:30 AM 05/02/2022    1:41 PM 10/17/2021    2:10 PM 12/13/2020   10:25 AM  PHQ 2/9 Scores  PHQ - 2 Score 0 0 0 0 0 0 0  PHQ- 9 Score 6 0 0 0  0 0  Exception Documentation --          Fall Risk     05/28/2023    10:34 AM 05/02/2022    1:46 PM 11/04/2019    9:00 AM  Fall Risk   Falls in the past year? 0 0 0  Number falls in past yr: 0 0   Injury with Fall? 0 0   Risk for fall due to : No Fall Risks No Fall Risks   Follow up Falls prevention discussed;Falls evaluation completed Falls prevention discussed     MEDICARE RISK AT HOME:  Medicare Risk at Home Any stairs in or around the home?: No If so, are there any without handrails?: No Home free of loose throw rugs in walkways, pet beds, electrical cords, etc?: Yes Adequate lighting in your home to reduce risk of falls?: Yes Life alert?: No Use of a cane, walker or w/c?: No Grab bars in the bathroom?: No (Patient very careful) Shower chair or bench in shower?: No Elevated toilet seat or a handicapped toilet?: No  TIMED UP AND GO:  Was the test performed?  No  Cognitive Function: Declined: Patient declined cognitive screening, but was able to answer questions in an accurate and timely manner. No cognitive impairments observed.    05/28/2023   10:37 AM  MMSE - Mini Mental State Exam  Not completed: Unable to complete        05/02/2022    1:47 PM  6CIT Screen  What Year? 4 points  What month? 3 points  What time? 0 points  Count back from 20 4 points  Months in reverse 4 points  Repeat phrase 10 points  Total Score 25 points    Immunizations Immunization History  Administered Date(s) Administered   Fluad Quad(high Dose 65+) 11/16/2020   Influenza, Seasonal, Injecte, Preservative Fre 11/28/2022   Influenza,inj,Quad PF,6+ Mos 10/23/2018, 11/04/2019   PFIZER(Purple Top)SARS-COV-2 Vaccination 11/04/2019, 11/25/2019   Pfizer Covid-19 Vaccine Bivalent Booster 59yrs & up 12/13/2020   Pfizer(Comirnaty)Fall Seasonal Vaccine 12 years  and older 11/28/2022   Tdap 08/24/2017    Screening Tests Health Maintenance  Topic Date Due   Zoster Vaccines- Shingrix (1 of 2) Never done   COVID-19 Vaccine (5 - Pfizer risk 2024-25 season)  05/29/2023   INFLUENZA VACCINE  09/21/2023   Medicare Annual Wellness (AWV)  05/27/2024   Colonoscopy  06/14/2024   DTaP/Tdap/Td (2 - Td or Tdap) 08/25/2027   Hepatitis C Screening  Completed   HIV Screening  Completed   HPV VACCINES  Aged Out    Health Maintenance  Health Maintenance Due  Topic Date Due   Zoster Vaccines- Shingrix (1 of 2) Never done   COVID-19 Vaccine (5 - Pfizer risk 2024-25 season) 05/29/2023   Health Maintenance Items Addressed: Yes; Patient is due for Covid-19 and Shingrix vaccines.  Additional Screening:  Vision Screening: Recommended annual ophthalmology exams for early detection of glaucoma and other disorders of the eye.  Dental Screening: Recommended annual dental exams for proper oral hygiene  Community Resource Referral / Chronic Care Management: CRR required this visit?  No   CCM required this visit?  No     Plan:     I have personally reviewed and noted the following in the patient's chart:   Medical and social history Use of alcohol, tobacco or illicit drugs  Current medications and supplements including opioid prescriptions. Patient is not currently taking opioid prescriptions. Functional ability and status Nutritional status Physical activity Advanced directives List of other physicians Hospitalizations, surgeries, and ER visits in previous 12 months Vitals Screenings to include cognitive, depression, and falls Referrals and appointments  In addition, I have reviewed and discussed with patient certain preventive protocols, quality metrics, and best practice recommendations. A written personalized care plan for preventive services as well as general preventive health recommendations were provided to patient.     Mickeal Needy, LPN   10/26/452   After Visit Summary: (Declined) Due to this being a telephonic visit, with patients personalized plan was offered to patient but patient Declined AVS at this time   Notes: Please  refer to Routing Comments.

## 2023-05-29 ENCOUNTER — Other Ambulatory Visit: Payer: Self-pay

## 2023-05-29 DIAGNOSIS — L209 Atopic dermatitis, unspecified: Secondary | ICD-10-CM

## 2023-05-29 MED ORDER — BETAMETHASONE VALERATE 0.1 % EX OINT: TOPICAL_OINTMENT | CUTANEOUS | 1 refills | Status: DC

## 2023-07-02 ENCOUNTER — Ambulatory Visit: Admitting: Student

## 2023-07-02 ENCOUNTER — Encounter: Payer: Self-pay | Admitting: Student

## 2023-07-02 VITALS — BP 131/69 | HR 96 | Ht 65.0 in | Wt 150.2 lb

## 2023-07-02 DIAGNOSIS — Z0000 Encounter for general adult medical examination without abnormal findings: Secondary | ICD-10-CM | POA: Diagnosis present

## 2023-07-02 DIAGNOSIS — I10 Essential (primary) hypertension: Secondary | ICD-10-CM

## 2023-07-02 DIAGNOSIS — E782 Mixed hyperlipidemia: Secondary | ICD-10-CM

## 2023-07-02 DIAGNOSIS — N429 Disorder of prostate, unspecified: Secondary | ICD-10-CM | POA: Diagnosis not present

## 2023-07-02 DIAGNOSIS — R7303 Prediabetes: Secondary | ICD-10-CM

## 2023-07-02 DIAGNOSIS — E785 Hyperlipidemia, unspecified: Secondary | ICD-10-CM | POA: Insufficient documentation

## 2023-07-02 LAB — POCT GLYCOSYLATED HEMOGLOBIN (HGB A1C): Hemoglobin A1C: 5.4 % (ref 4.0–5.6)

## 2023-07-02 NOTE — Patient Instructions (Signed)
 It was great to see you today! Thank you for choosing [facility] for your primary care.  Today we addressed: Blood pressure looks great, A1c is in normal range. I provided advanced directive booklet, I do recommend obtaining a shingles vaccine at your local pharmacy.  If you haven't already, sign up for My Chart to have easy access to your labs results, and communication with your primary care physician. We are checking some labs today. If they are abnormal, I will call you. If they are normal, I will send you a MyChart message (if it is active) or a letter in the mail. If you do not hear about your labs in the next 2 weeks, please call the office. Return in about 6 months (around 01/02/2024). Please arrive 15 minutes before your appointment to ensure smooth check in process.  We appreciate your efforts in making this happen.  Thank you for allowing me to participate in your care, Bryan Goon, DO 07/02/2023, 9:21 AM PGY-3,  Family Medicine   Things to do to Keep yourself Healthy  - Exercise at least 30-45 minutes a day, 3-4 days a week. >150 min of moderate intensity per week is advised. - Eat a low-fat diet with lots of fruits and vegetables, up to 7-9 servings per day. - Seatbelts can save your life. Wear them always. - Smoke detectors on every level of your home, check batteries every year. - Eye Doctor - have an eye exam every 1-2 years. - Safe sex - if you may be exposed to STDs, use a condom. - Alcohol If you drink, do it moderately, less than 2 drinks per day. - Health Care Power of Attorney.  Choose someone to speak for you if you are not able. - Depression is common in our stressful world.If you're feeling down or losing interest in things you normally enjoy, please come in for a visit.

## 2023-07-02 NOTE — Progress Notes (Signed)
    SUBJECTIVE:   Chief compliant/HPI: annual examination  Bryan Ortiz is a 58 y.o. who presents today for an annual exam.   History tabs reviewed and updated.   OBJECTIVE:  BP 131/69   Pulse 96   Ht 5\' 5"  (1.651 m)   Wt 150 lb 3.2 oz (68.1 kg)   SpO2 100%   BMI 24.99 kg/m   General: Well-appearing, NAD CV: RRR, murmurs appreciable Pulm: CTAB, normal WOB  ASSESSMENT/PLAN:   Assessment & Plan Annual physical exam PHQ score 0, reviewed and discussed.  Blood pressure value is at goal, discussed.  Advanced directive booklet provided. Recommend shingles vaccine at local pharmacy.  Considered the following screening exams based upon USPSTF recommendations: Diabetes screening: ordered HIV/HepB/HepC/Syphilis testing: low risk, not indicated Reviewed risk factors for latent tuberculosis and not indicated Colorectal cancer screening: up to date on screening for CRC. Lung cancer screening: not indicated. See documentation below regarding discussion and indication.  PSA discussed and after engaging in discussion of possible risks, benefits and complications of screening patient elected to proceed.  Primary hypertension BP: 131/69 today. Well controlled. Goal of <140/90. Medication regimen: Amlodipine  10 mg daily, losartan  25 mg daily. Prediabetes A1c 5.4, normal ranges. Hypercholesterolemia with hypertriglyceridemia Recheck lipid panel and check LFTs, continue atorvastatin  10 mg daily for now.  Titrate accordingly. Disorder of prostate, unspecified PSA for prostate cancer screening.  Veronia Goon, DO [facility]

## 2023-07-02 NOTE — Assessment & Plan Note (Signed)
 BP: 131/69 today. Well controlled. Goal of <140/90. Medication regimen: Amlodipine  10 mg daily, losartan  25 mg daily.

## 2023-07-02 NOTE — Assessment & Plan Note (Signed)
 A1c 5.4, normal ranges.

## 2023-07-03 LAB — COMPREHENSIVE METABOLIC PANEL WITH GFR
ALT: 20 IU/L (ref 0–44)
AST: 24 IU/L (ref 0–40)
Albumin: 4.6 g/dL (ref 3.8–4.9)
Alkaline Phosphatase: 95 IU/L (ref 44–121)
BUN/Creatinine Ratio: 12 (ref 9–20)
BUN: 11 mg/dL (ref 6–24)
Bilirubin Total: 0.4 mg/dL (ref 0.0–1.2)
CO2: 23 mmol/L (ref 20–29)
Calcium: 10.1 mg/dL (ref 8.7–10.2)
Chloride: 102 mmol/L (ref 96–106)
Creatinine, Ser: 0.94 mg/dL (ref 0.76–1.27)
Globulin, Total: 2.9 g/dL (ref 1.5–4.5)
Glucose: 102 mg/dL — ABNORMAL HIGH (ref 70–99)
Potassium: 4.2 mmol/L (ref 3.5–5.2)
Sodium: 141 mmol/L (ref 134–144)
Total Protein: 7.5 g/dL (ref 6.0–8.5)
eGFR: 96 mL/min/{1.73_m2} (ref >59)

## 2023-07-03 LAB — LIPID PANEL
Chol/HDL Ratio: 3 ratio (ref 0.0–5.0)
Cholesterol, Total: 134 mg/dL (ref 100–199)
HDL: 44 mg/dL (ref >39)
LDL Chol Calc (NIH): 74 mg/dL (ref 0–99)
Triglycerides: 81 mg/dL (ref 0–149)
VLDL Cholesterol Cal: 16 mg/dL (ref 5–40)

## 2023-07-03 LAB — PSA: Prostate Specific Ag, Serum: 1.5 ng/mL (ref 0.0–4.0)

## 2023-07-04 ENCOUNTER — Other Ambulatory Visit: Payer: Self-pay | Admitting: Student

## 2023-07-04 ENCOUNTER — Ambulatory Visit: Payer: Self-pay | Admitting: Student

## 2023-07-04 DIAGNOSIS — E782 Mixed hyperlipidemia: Secondary | ICD-10-CM

## 2023-07-04 MED ORDER — ATORVASTATIN CALCIUM 10 MG PO TABS: 10.0000 mg | ORAL_TABLET | Freq: Every day | ORAL | 3 refills | Status: AC

## 2023-08-09 ENCOUNTER — Other Ambulatory Visit: Payer: Self-pay | Admitting: Family Medicine

## 2023-08-09 DIAGNOSIS — I10 Essential (primary) hypertension: Secondary | ICD-10-CM

## 2023-11-08 ENCOUNTER — Other Ambulatory Visit: Payer: Self-pay

## 2023-11-08 DIAGNOSIS — L209 Atopic dermatitis, unspecified: Secondary | ICD-10-CM

## 2023-11-08 MED ORDER — BETAMETHASONE VALERATE 0.1 % EX OINT: TOPICAL_OINTMENT | CUTANEOUS | 1 refills | Status: AC

## 2023-11-15 ENCOUNTER — Other Ambulatory Visit: Payer: Self-pay | Admitting: Family Medicine

## 2023-11-15 DIAGNOSIS — I10 Essential (primary) hypertension: Secondary | ICD-10-CM

## 2023-11-30 ENCOUNTER — Encounter: Payer: Self-pay | Admitting: Family Medicine

## 2023-11-30 ENCOUNTER — Ambulatory Visit (INDEPENDENT_AMBULATORY_CARE_PROVIDER_SITE_OTHER): Admitting: Family Medicine

## 2023-11-30 VITALS — BP 138/68 | Wt 151.6 lb

## 2023-11-30 DIAGNOSIS — Z23 Encounter for immunization: Secondary | ICD-10-CM

## 2023-11-30 DIAGNOSIS — I10 Essential (primary) hypertension: Secondary | ICD-10-CM

## 2023-11-30 MED ORDER — LOSARTAN POTASSIUM 50 MG PO TABS: 50.0000 mg | ORAL_TABLET | Freq: Every day | ORAL | 3 refills | Status: AC

## 2023-11-30 NOTE — Patient Instructions (Signed)
 It was wonderful to see you today.  Please bring ALL of your medications with you to every visit.   Today we talked about:  Blood pressure - Your blood pressure has been elevated above goal multiple times now. I have increased your losartan  from 25 mg to 50 mg. We will do a follow up in 2 weeks for blood pressure and labs.   Please follow up in 2 weeks   Thank you for choosing [facility].   Please call [phone] with any questions about today's appointment.  Please be sure to schedule follow up at the front desk before you leave today.   Areta Saliva, MD  Family Medicine

## 2023-11-30 NOTE — Progress Notes (Signed)
    SUBJECTIVE:   CHIEF COMPLAINT / HPI:   Hypertension  Patient's blood pressure is elevated for his goal of less than 130/80 for prediabetes.  Patient is currently on amlodipine  10 mg and Cozaar  25 mg which he takes at night.  He is taking this every single night without any lapses.  PERTINENT  PMH / PSH: Hypertension, prediabetes  OBJECTIVE:   BP 138/68   Wt 151 lb 9.6 oz (68.8 kg)   SpO2 100%   BMI 25.23 kg/m   General Well appearing, developmental delay CV: RRR, no murmurs rubs or gallops, no bilateral lower extremity edema Resp: Normal work of breathing on room air, CTAB  ASSESSMENT/PLAN:   Assessment & Plan Hypertension, unspecified type Not at adequate control for patient despite good adherence to antihypertensives. - Continue amlodipine  10 mg, increase Cozaar  from 25 mg to 50 mg - Follow-up in 2 weeks for BP recheck and BMP. Encounter for immunization Patient received pneumococcal vaccine, hep B vaccine, and flu vaccine today     Areta Saliva, MD [facility]

## 2023-12-01 NOTE — Assessment & Plan Note (Signed)
 Not at adequate control for patient despite good adherence to antihypertensives. - Continue amlodipine  10 mg, increase Cozaar  from 25 mg to 50 mg - Follow-up in 2 weeks for BP recheck and BMP.

## 2023-12-17 ENCOUNTER — Encounter: Payer: Self-pay | Admitting: Family Medicine

## 2023-12-17 ENCOUNTER — Ambulatory Visit (INDEPENDENT_AMBULATORY_CARE_PROVIDER_SITE_OTHER): Admitting: Family Medicine

## 2023-12-17 VITALS — BP 146/72 | HR 90 | Ht 65.0 in | Wt 153.0 lb

## 2023-12-17 DIAGNOSIS — I10 Essential (primary) hypertension: Secondary | ICD-10-CM

## 2023-12-17 NOTE — Progress Notes (Signed)
   SUBJECTIVE:   CHIEF COMPLAINT / HPI:  Discussed the use of AI scribe software for clinical note transcription with the patient, who gave verbal consent to proceed.  History of Present Illness Bryan Ortiz is a 58 year old male with hypertension who presents for blood pressure follow-up. He is accompanied by his father.  Hypertension - Elevated systolic blood pressure in the high 130s during last visit on November 30, 2023 - Target blood pressure is less than 130/80 due to prediabetes - amlodipine  10 mg and Cozaar  (losartan ) 25 mg at night prior to last visit, with no lapses in medication adherence, Cozaar  dose increased to 50 mg at last visit while continuing amlodipine  - Father reports blood pressure sometimes improves after leaving the clinic - Home blood pressure monitoring performed at least once a week by his father using an arm cuff during which patients BP has been normal.  - Pt denies any side effects with increased dose of cozaar .      PERTINENT  PMH / PSH: HTN, intellectual disability, H/o prediabetes   OBJECTIVE:  BP (!) 160/64   Pulse 90   Ht 5' 5 (1.651 m)   Wt 153 lb (69.4 kg)   SpO2 99%   BMI 25.46 kg/m   General: well appearing, in no acute distress CV: well perfused, no BLE edema  Resp: Normal work of breathing on room air Neuro: Alert & Oriented, cognitive delay  ASSESSMENT/PLAN:   Assessment & Plan Primary hypertension BP elevated today despite increased dose of cozaar . Could be white coat hypertension given reported normal BP at home per father.  - Continue Amlodipine  10 mg and cozaar  50 mg nightly   - BMP today  - Pharmacy clinic referral for ambulatory blood pressure monitor with appointments made for next week.  - Advised father to keep blood pressure log    Recommended COVID vaccine today which father agreed to. Did not complete vaccination due to technical difficulty with [facility]. Recommended obtaining vaccination at a pharmacy.   Areta Saliva,  MD [facility]

## 2023-12-17 NOTE — Patient Instructions (Signed)
 It was wonderful to see you today.  Please bring ALL of your medications with you to every visit.   Today we talked about:  Elevated blood pressure - Your blood pressure is still higher than goal. Since it is usually lower at home we are going to do an ambulatory blood pressure monitor to see how it is outside of the clinic. You have an appointment scheduled with Dr. Koval next week.   We will do some labs today to see how the increased dose of medicine has affected your kidneys.   Unfortunately we were unable to do the COVID vaccine in clinic today due to a technical error, but I do recommend getting this at the pharmacy.   Thank you for choosing [facility].   Please call [phone] with any questions about today's appointment.  Areta Saliva, MD  Family Medicine

## 2023-12-17 NOTE — Assessment & Plan Note (Signed)
 BP elevated today despite increased dose of cozaar . Could be white coat hypertension given reported normal BP at home per father.  - Continue Amlodipine  10 mg and cozaar  50 mg nightly   - BMP today  - Pharmacy clinic referral for ambulatory blood pressure monitor with appointments made for next week.  - Advised father to keep blood pressure log

## 2023-12-18 LAB — BASIC METABOLIC PANEL WITH GFR
BUN/Creatinine Ratio: 12 (ref 9–20)
BUN: 12 mg/dL (ref 6–24)
CO2: 21 mmol/L (ref 20–29)
Calcium: 9.7 mg/dL (ref 8.7–10.2)
Chloride: 104 mmol/L (ref 96–106)
Creatinine, Ser: 0.99 mg/dL (ref 0.76–1.27)
Glucose: 136 mg/dL — ABNORMAL HIGH (ref 70–99)
Potassium: 4 mmol/L (ref 3.5–5.2)
Sodium: 142 mmol/L (ref 134–144)
eGFR: 89 mL/min/1.73 (ref >59)

## 2023-12-20 ENCOUNTER — Ambulatory Visit: Payer: Self-pay | Admitting: Family Medicine

## 2023-12-24 ENCOUNTER — Ambulatory Visit (INDEPENDENT_AMBULATORY_CARE_PROVIDER_SITE_OTHER): Admitting: Pharmacist

## 2023-12-24 ENCOUNTER — Encounter: Payer: Self-pay | Admitting: Pharmacist

## 2023-12-24 VITALS — BP 165/76 | HR 100 | Wt 151.8 lb

## 2023-12-24 DIAGNOSIS — I10 Essential (primary) hypertension: Secondary | ICD-10-CM

## 2023-12-24 NOTE — Patient Instructions (Signed)
 Blood Pressure Activity Diary Time Lying down/ Sleeping Walking/ Exercise Stressed/ Angry Headache/ Pain Dizzy  9 AM       10 AM       11 AM       12 PM       1 PM       2 PM       Time Lying down/ Sleeping Walking/ Exercise Stressed/ Angry Headache/ Pain Dizzy  3 PM       4 PM        5 PM       6 PM       7 PM       8 PM       Time Lying down/ Sleeping Walking/ Exercise Stressed/ Angry Headache/ Pain Dizzy  9 PM       10 PM       11 PM       12 AM       1 AM       2 AM       3 AM       Time Lying down/ Sleeping Walking/ Exercise Stressed/ Angry Headache/ Pain Dizzy  4 AM       5 AM       6 AM       7 AM       8 AM       9 AM       10 AM        Time you woke up: _________                  Time you went to sleep:__________  Come back tomorrow at 9 am to have the monitor removed  Call the [facility] if you have any questions before then ([phone])  Wearing the Blood Pressure Monitor The cuff will inflate every 20 minutes during the day and every 30 minutes while you sleep. Fill out the blood pressure-activity diary during the day, especially during activities that may affect your reading -- such as exercise, stress, walking, taking your blood pressure medications  Important things to know: Avoid taking the monitor off for the next 24 hours, unless it causes you discomfort or pain. Do NOT get the monitor wet and do NOT try to clean the monitor with any cleaning products. Do NOT put the monitor on anyone else's arm. When the cuff inflates, avoid excess movement. Let the cuffed arm hang loosely, slightly away from the body. Avoid flexing the muscles or moving the hand/fingers. Remember to fill out the blood pressure activity diary. If you experience severe pain or unusual pain (not associated with getting your blood pressure checked), remove the monitor.  Troubleshooting:  Code  Troubleshooting   1  Check cuff position, tighten cuff   2, 3  Remain still  during reading   4, 87  Check air hose connections and make sure cuff is tight   85, 89  Check hose connections and make tubing is not crimped   86  Push START/STOP to restart reading   88, 91  Retry by pushing START/STOP   90  Replace batteries. If problem persists, remove monitor and bring back to   clinic at follow up   97, 98, 99  Service required - Remove monitor and bring back to clinic at follow up

## 2023-12-24 NOTE — Progress Notes (Signed)
 S:     Chief Complaint  Patient presents with   Medication Management    Amb Blood Pressure Day 1   58 y.o. male who presents for hypertension evaluation, education, and management. Patient arrives in  good spirits and presents without any assistance. Patient is accompanied by father.   Patient was referred and last seen by Primary Care Provider, Dr. Nicholas, on 12/17/2023.  At last visit, patient was referred for Ambulatory Blood Pressure monitoring .   PMH is significant for hypertension, hyperlipidemia, developmental delay, prediabetes  Diagnosed with Hypertension in the year of 2022.    Medication compliance is reported to be good.  Discussed procedure for wearing the monitor and gave patient written instructions. Monitor was placed on non-dominant arm with instructions to return in the morning.   Current BP Medications include:  amlodipine  10 mg daily, losartan  50 mg daily  O:  Review of Systems  All other systems reviewed and are negative.   Physical Exam Vitals reviewed.  Constitutional:      Appearance: Normal appearance.  Pulmonary:     Effort: Pulmonary effort is normal.  Neurological:     Mental Status: He is alert.  Psychiatric:        Mood and Affect: Mood normal.        Behavior: Behavior normal.        Thought Content: Thought content normal.        Judgment: Judgment normal.    Last 3 Office BP readings: BP Readings from Last 3 Encounters:  12/17/23 (!) 146/72  11/30/23 138/68  07/02/23 131/69    Clinical Atherosclerotic Cardiovascular Disease (ASCVD): No  The 10-year ASCVD risk score (Arnett DK, et al., 2019) is: 13.4%   Values used to calculate the score:     Age: 66 years     Clincally relevant sex: Male     Is Non-Hispanic African American: Yes     Diabetic: No     Tobacco smoker: No     Systolic Blood Pressure: 146 mmHg     Is BP treated: Yes     HDL Cholesterol: 44 mg/dL     Total Cholesterol: 134 mg/dL  Basic Metabolic Panel     Component Value Date/Time   NA 142 12/17/2023 1439   K 4.0 12/17/2023 1439   CL 104 12/17/2023 1439   CO2 21 12/17/2023 1439   GLUCOSE 136 (H) 12/17/2023 1439   BUN 12 12/17/2023 1439   CREATININE 0.99 12/17/2023 1439   CALCIUM  9.7 12/17/2023 1439   GFRNONAA 88 08/24/2017 1406   GFRAA 102 08/24/2017 1406    Renal function: Estimated Creatinine Clearance: 72.5 mL/min (by C-G formula based on SCr of 0.99 mg/dL).   ABPM Study Data: Arm Placement left arm  For Office Goal BP of <130/80 mmHg:  ABPM thresholds: Overall BP <125/75 mmHg, daytime BP <130/80 mmHg, sleeptime BP <110/65 mmHg   A/P: History of hypertension since 2022 longstanding and uncontrolled currently taking amlodipine  and losartan ; with goal presssure of <130/80 mm Hg. Patient's father reports adherence to medications and reports that they do not take Blood Pressure readings at home but have a monitor at home. -Placed blood pressure cuff, provided education, patient instructed to wear cuff for 24 hours and return tomorrow to review results.  Written patient instructions provided including activity/symptom/event log. Patient verbalized understanding of plan. Total time in face to face counseling 20 minutes.    Follow-up: Tomorrow AM - early morning appointment at 9am Patient seen  with Lawson Mao, PharmD Candidate - [employer] student and Recardo [employer], PharmD - [employer] Candidate.

## 2023-12-24 NOTE — Assessment & Plan Note (Signed)
 History of hypertension since 2022 longstanding and uncontrolled currently taking amlodipine  and losartan ; with goal presssure of <130/80 mm Hg. Patient's father reports adherence to medications and reports that they do not take Blood Pressure readings at home but have a monitor at home. -Placed blood pressure cuff, provided education, patient instructed to wear cuff for 24 hours and return tomorrow to review results.

## 2023-12-25 ENCOUNTER — Ambulatory Visit: Admitting: Pharmacist

## 2023-12-25 ENCOUNTER — Encounter: Payer: Self-pay | Admitting: Pharmacist

## 2023-12-25 VITALS — BP 128/70

## 2023-12-25 DIAGNOSIS — I10 Essential (primary) hypertension: Secondary | ICD-10-CM

## 2023-12-25 NOTE — Progress Notes (Signed)
 S:     Chief Complaint  Patient presents with   Medication Management    Amb Blood Pressure Day 2   58 y.o. male who presents for hypertension evaluation, education, and management.  Patient arrives in good spirits and presents without  any assistance. Patient is accompanied by father.   Patient returns to clinic with 24 hour blood pressure monitor and reports they were able to wear the ambulatory blood pressure cuff for the entire 24 evaluation period.   Patient was referred and last seen by Primary Care Provider, Dr. Nicholas, on 12/17/2023.  At last visit, patient was referred for Ambulatory Blood Pressure monitoring .    PMH is significant for hypertension, hyperlipidemia, developmental delay, prediabetes   Diagnosed with Hypertension in the year of 2022.     Medication compliance is reported to be good. O:  Review of Systems  All other systems reviewed and are negative.   Physical Exam Vitals reviewed.  Constitutional:      Appearance: Normal appearance.  Pulmonary:     Effort: Pulmonary effort is normal.  Neurological:     Mental Status: He is alert.  Psychiatric:        Mood and Affect: Mood normal.        Thought Content: Thought content normal.    Last 3 Office BP readings: BP Readings from Last 3 Encounters:  12/25/23 128/70  12/24/23 (!) 165/76  12/17/23 (!) 146/72   Clinical Atherosclerotic Cardiovascular Disease (ASCVD): No  The 10-year ASCVD risk score (Arnett DK, et al., 2019) is: 10.6%   Values used to calculate the score:     Age: 74 years     Clincally relevant sex: Male     Is Non-Hispanic African American: Yes     Diabetic: No     Tobacco smoker: No     Systolic Blood Pressure: 128 mmHg     Is BP treated: Yes     HDL Cholesterol: 44 mg/dL     Total Cholesterol: 134 mg/dL  Basic Metabolic Panel    Component Value Date/Time   NA 142 12/17/2023 1439   K 4.0 12/17/2023 1439   CL 104 12/17/2023 1439   CO2 21 12/17/2023 1439   GLUCOSE 136  (H) 12/17/2023 1439   BUN 12 12/17/2023 1439   CREATININE 0.99 12/17/2023 1439   CALCIUM  9.7 12/17/2023 1439   GFRNONAA 88 08/24/2017 1406   GFRAA 102 08/24/2017 1406   Renal function: Estimated Creatinine Clearance: 72.5 mL/min (by C-G formula based on SCr of 0.99 mg/dL).  ABPM Study Data: Arm Placement left arm  Overall Mean 24hr BP:   128/65 mmHg  HR: 85  Daytime Mean BP:  128/65 mmHg  HR: 85    For Office Goal BP of <130/80 mmHg:  ABPM thresholds: Overall BP <125/75 mmHg, daytime BP <130/80 mmHg, sleeptime BP <110/65 mmHg   A/P: History of hypertension since 2022 longstanding and uncontrolled currently taking amlodipine  and losartan ; with goal presssure of <130/80 mm Hg. Patient's father reports adherence to medications and reports that they do not take Blood Pressure readings at home but have a monitor at home. 24-hour ambulatory blood pressure evaluation which demonstrates an average AWAKE blood pressure of 128/65 mmHg. Ambulatory Blood Pressure monitoring reveals normal Blood Pressure with [place] hypertension. Did not change medication regimen.  Results reviewed and written information provided.    Written patient instructions provided. Patient verbalized understanding of treatment plan.  Total time in face to face counseling 24  minutes.    Follow-up:  PCP clinic visit not scheduled, can make apt as needed.   Patient seen with Lawson Mao, PharmD Candidate - [employer] student and Recardo [employer] PharmD - [employer] Candidate.

## 2023-12-25 NOTE — Progress Notes (Signed)
 Reviewed and agree with Dr Rennis plan.

## 2023-12-25 NOTE — Patient Instructions (Signed)
 It was nice to see you today!  Thank you for completing the blood pressure monitoring evaluation. Your pressures were normal!  Your goal blood pressure is < 130/80 mmHg   Medication Changes: Continue other medication the same.   Monitor blood pressure at home and keep a log (on a piece of paper) to bring with you to your next visit.

## 2023-12-25 NOTE — Assessment & Plan Note (Signed)
 History of hypertension since 2022 longstanding and uncontrolled currently taking amlodipine  and losartan ; with goal presssure of <130/80 mm Hg. Patient's father reports adherence to medications and reports that they do not take Blood Pressure readings at home but have a monitor at home. 24-hour ambulatory blood pressure evaluation which demonstrates an average AWAKE blood pressure of 128/65 mmHg. Ambulatory Blood Pressure monitoring reveals normal Blood Pressure with [place] hypertension. Did not change medication regimen.

## 2023-12-26 ENCOUNTER — Encounter: Payer: Self-pay | Admitting: Family Medicine

## 2024-02-06 ENCOUNTER — Other Ambulatory Visit: Payer: Self-pay | Admitting: Family Medicine

## 2024-02-06 DIAGNOSIS — I10 Essential (primary) hypertension: Secondary | ICD-10-CM

## 2024-05-29 ENCOUNTER — Encounter
